# Patient Record
Sex: Female | Born: 1955 | Race: White | Hispanic: No | Marital: Single | Smoking: Former smoker
Health system: Southern US, Community
[De-identification: ages and names within clinical notes are randomized; demographics above are authoritative.]

## PROBLEM LIST (undated history)

## (undated) DIAGNOSIS — C801 Malignant (primary) neoplasm, unspecified: Secondary | ICD-10-CM

## (undated) DIAGNOSIS — Z860100 Personal history of colon polyps, unspecified: Secondary | ICD-10-CM

## (undated) DIAGNOSIS — F32A Depression, unspecified: Secondary | ICD-10-CM

## (undated) DIAGNOSIS — M722 Plantar fascial fibromatosis: Secondary | ICD-10-CM

## (undated) HISTORY — PX: WISDOM TOOTH EXTRACTION: SHX21

## (undated) HISTORY — DX: Plantar fascial fibromatosis: M72.2

## (undated) HISTORY — PX: UPPER GASTROINTESTINAL ENDOSCOPY: SHX188

## (undated) HISTORY — DX: Personal history of colonic polyps: Z86.010

## (undated) HISTORY — DX: Personal history of colon polyps, unspecified: Z86.0100

## (undated) HISTORY — DX: Malignant (primary) neoplasm, unspecified: C80.1

## (undated) HISTORY — DX: Depression, unspecified: F32.A

## (undated) HISTORY — PX: COLONOSCOPY: SHX174

## (undated) HISTORY — PX: SKIN CANCER EXCISION: SHX779

## (undated) HISTORY — PX: OTHER SURGICAL HISTORY: SHX169

---

## 2000-11-17 ENCOUNTER — Other Ambulatory Visit: Admission: RE | Admit: 2000-11-17 | Discharge: 2000-11-17 | Payer: Self-pay | Admitting: Internal Medicine

## 2012-05-12 ENCOUNTER — Encounter: Payer: Self-pay | Admitting: Internal Medicine

## 2012-06-30 ENCOUNTER — Ambulatory Visit (AMBULATORY_SURGERY_CENTER): Payer: 59 | Admitting: *Deleted

## 2012-06-30 VITALS — Ht 64.0 in | Wt 120.2 lb

## 2012-06-30 DIAGNOSIS — Z1211 Encounter for screening for malignant neoplasm of colon: Secondary | ICD-10-CM

## 2012-06-30 MED ORDER — PEG-KCL-NACL-NASULF-NA ASC-C 100 G PO SOLR: ORAL | Status: DC

## 2012-07-03 ENCOUNTER — Encounter: Payer: Self-pay | Admitting: Internal Medicine

## 2012-07-07 ENCOUNTER — Encounter: Payer: Self-pay | Admitting: Internal Medicine

## 2012-07-14 ENCOUNTER — Encounter: Payer: Self-pay | Admitting: Internal Medicine

## 2012-07-14 ENCOUNTER — Ambulatory Visit (AMBULATORY_SURGERY_CENTER): Payer: 59 | Admitting: Internal Medicine

## 2012-07-14 VITALS — BP 119/72 | HR 62 | Temp 96.6°F | Resp 18 | Ht 64.0 in | Wt 120.0 lb

## 2012-07-14 DIAGNOSIS — Z1211 Encounter for screening for malignant neoplasm of colon: Secondary | ICD-10-CM

## 2012-07-14 DIAGNOSIS — D126 Benign neoplasm of colon, unspecified: Secondary | ICD-10-CM

## 2012-07-14 MED ORDER — SODIUM CHLORIDE 0.9 % IV SOLN: 500.0000 mL | INTRAVENOUS | Status: DC

## 2012-07-14 NOTE — Progress Notes (Signed)
Patient did not experience any of the following events: a burn prior to discharge; a fall within the facility; wrong site/side/patient/procedure/implant event; or a hospital transfer or hospital admission upon discharge from the facility. (G8907) Patient did not have preoperative order for IV antibiotic SSI prophylaxis. (G8918)  

## 2012-07-14 NOTE — Patient Instructions (Addendum)
YOU HAD AN ENDOSCOPIC PROCEDURE TODAY AT THE [facility]: Refer to the procedure report that was given to you for any specific questions about what was found during the examination.  If the procedure report does not answer your questions, please call your gastroenterologist to clarify.  If you requested that your care partner not be given the details of your procedure findings, then the procedure report has been included in a sealed envelope for you to review at your convenience later.  YOU SHOULD EXPECT: Some feelings of bloating in the abdomen. Passage of more gas than usual.  Walking can help get rid of the air that was put into your GI tract during the procedure and reduce the bloating. If you had a lower endoscopy (such as a colonoscopy or flexible sigmoidoscopy) you may notice spotting of blood in your stool or on the toilet paper. If you underwent a bowel prep for your procedure, then you may not have a normal bowel movement for a few days.  DIET: Your first meal following the procedure should be a light meal and then it is ok to progress to your normal diet.  A half-sandwich or bowl of soup is an example of a good first meal.  Heavy or fried foods are harder to digest and may make you feel nauseous or bloated.  Likewise meals heavy in dairy and vegetables can cause extra gas to form and this can also increase the bloating.  Drink plenty of fluids but you should avoid alcoholic beverages for 24 hours.  ACTIVITY: Your care partner should take you home directly after the procedure.  You should plan to take it easy, moving slowly for the rest of the day.  You can resume normal activity the day after the procedure however you should NOT DRIVE or use heavy machinery for 24 hours (because of the sedation medicines used during the test).    SYMPTOMS TO REPORT IMMEDIATELY: A gastroenterologist can be reached at any hour.  During normal business hours, 8:30 AM to 5:00 PM Monday through Friday,  call [phone].  After hours and on weekends, please call the GI answering service at [phone] who will take a message and have the physician on call contact you.   Following lower endoscopy (colonoscopy or flexible sigmoidoscopy):  Excessive amounts of blood in the stool  Significant tenderness or worsening of abdominal pains  Swelling of the abdomen that is new, acute  Fever of 100F or    FOLLOW UP: If any biopsies were taken you will be contacted by phone or by letter within the next 1-3 weeks.  Call your gastroenterologist if you have not heard about the biopsies in 3 weeks.  Our staff will call the home number listed on your records the next business day following your procedure to check on you and address any questions or concerns that you may have at that time regarding the information given to you following your procedure. This is a courtesy call and so if there is no answer at the home number and we have not heard from you through the emergency physician on call, we will assume that you have returned to your regular daily activities without incident.  SIGNATURES/CONFIDENTIALITY: You and/or your care partner have signed paperwork which will be entered into your electronic medical record.  These signatures attest to the fact that that the information above on your After Visit Summary has been reviewed and is understood.  Full responsibility of the confidentiality of  this discharge information lies with you and/or your care-partner.   Polyp, diverticulosis, and high fiber information given.  Repeat colonoscopy in 6 months.

## 2012-07-14 NOTE — Progress Notes (Signed)
Report to pacu rn, vss, bbs=clear 

## 2012-07-14 NOTE — Progress Notes (Signed)
Called to room to assist during endoscopic procedure.  Patient ID and intended procedure confirmed with present staff. Received instructions for my participation in the procedure from the performing physician.  

## 2012-07-14 NOTE — Op Note (Signed)
[facility] [address] [place], [address]   COLONOSCOPY PROCEDURE REPORT  PATIENT: Shaunette, Gassner  MR#: [id number] BIRTHDATE: 1955/07/17 , 56  yrs. old GENDER: Female ENDOSCOPIST: Roxy Cedar, MD REFERRED XB:[id number] Tisovec, M.D. PROCEDURE DATE:  07/14/2012 PROCEDURE:   Colonoscopy with snare polypectomy  x 1 ASA CLASS:   Class II INDICATIONS:elevated risk screening. MEDICATIONS: MAC sedation, administered by CRNA and propofol (Diprivan) 270mg  IV  DESCRIPTION OF PROCEDURE:   After the risks benefits and alternatives of the procedure were thoroughly explained, informed consent was obtained.  A digital rectal exam revealed no abnormalities of the rectum.   The LB [id number]  endoscope was introduced through the anus and advanced to the cecum, which was identified by both the appendix and ileocecal valve. No adverse events experienced.   The quality of the prep was excellent, using MoviPrep  The instrument was then slowly withdrawn as the colon was fully examined.      COLON FINDINGS: A semi-pedunculated polyp measuring 35 mm in size was found in the ectum 5cm from the anal verge.  A piecemeal polypectomy was performed using snare cautery.  The resection was complete and the polyp tissue was completely retrieved.   Severe diverticulosis was noted throughout the entire examined colon. Retroflexed views revealed no abnormalities. The time to cecum=5 minutes 23 seconds.  Withdrawal time=9 minutes 44 seconds.  The scope was withdrawn and the procedure completed. COMPLICATIONS: There were no complications.  ENDOSCOPIC IMPRESSION: 1.   Semi-pedunculated polyp measuring 35 mm in size was found; peicemeal polypectomy was performed using snare cautery 2.   Severe diverticulosis was noted throughout the entire examined colon 3. Otherwise normal exam  RECOMMENDATIONS: 1. Repeat Colonoscopy in 6 months in no invasive cancer in  resection specimen.   eSigned:  Roxy Cedar, MD 07/14/2012 9:56 AM  cc: Guerry Bruin, MD and The Patient   PATIENT NAME:  Sarah Chase, Sarah Chase MR#: [id number]

## 2012-07-17 ENCOUNTER — Telehealth: Payer: Self-pay | Admitting: *Deleted

## 2012-07-17 NOTE — Telephone Encounter (Signed)
  Follow up Call-  Call back number 07/14/2012  Post procedure Call Back phone  # [phone]  Permission to leave phone message Yes     Patient questions:  Do you have a fever, pain , or abdominal swelling? no Pain Score  0 *  Have you tolerated food without any problems? yes  Have you been able to return to your normal activities? yes  Do you have any questions about your discharge instructions: Diet   no Medications  no Follow up visit  no  Do you have questions or concerns about your Care? no  Actions: * If pain score is 4 or above: No action needed, pain <4.

## 2012-07-21 ENCOUNTER — Encounter: Payer: Self-pay | Admitting: Internal Medicine

## 2012-11-18 ENCOUNTER — Encounter: Payer: Self-pay | Admitting: Internal Medicine

## 2012-11-28 ENCOUNTER — Encounter: Payer: Self-pay | Admitting: Internal Medicine

## 2013-01-16 ENCOUNTER — Encounter: Payer: Self-pay | Admitting: Internal Medicine

## 2013-01-24 ENCOUNTER — Ambulatory Visit: Payer: 59 | Admitting: Internal Medicine

## 2013-03-13 ENCOUNTER — Ambulatory Visit (AMBULATORY_SURGERY_CENTER): Payer: Self-pay

## 2013-03-13 VITALS — Ht 64.0 in | Wt 121.0 lb

## 2013-03-13 DIAGNOSIS — Z800 Family history of malignant neoplasm of digestive organs: Secondary | ICD-10-CM

## 2013-03-13 DIAGNOSIS — Z86010 Personal history of colonic polyps: Secondary | ICD-10-CM

## 2013-03-13 MED ORDER — MOVIPREP 100 G PO SOLR: ORAL | Status: DC

## 2013-03-16 ENCOUNTER — Encounter: Payer: Self-pay | Admitting: Internal Medicine

## 2013-03-27 ENCOUNTER — Ambulatory Visit (AMBULATORY_SURGERY_CENTER): Payer: 59 | Admitting: Internal Medicine

## 2013-03-27 ENCOUNTER — Encounter: Payer: Self-pay | Admitting: Internal Medicine

## 2013-03-27 VITALS — BP 106/73 | HR 64 | Temp 97.8°F | Resp 17 | Ht 64.0 in | Wt 121.0 lb

## 2013-03-27 DIAGNOSIS — D126 Benign neoplasm of colon, unspecified: Secondary | ICD-10-CM

## 2013-03-27 DIAGNOSIS — Z86010 Personal history of colonic polyps: Secondary | ICD-10-CM

## 2013-03-27 MED ORDER — SODIUM CHLORIDE 0.9 % IV SOLN: 500.0000 mL | INTRAVENOUS | Status: DC

## 2013-03-27 NOTE — Progress Notes (Signed)
Called to room to assist during endoscopic procedure.  Patient ID and intended procedure confirmed with present staff. Received instructions for my participation in the procedure from the performing physician.  

## 2013-03-27 NOTE — Op Note (Signed)
[facility] [address] [place], [address]   COLONOSCOPY PROCEDURE REPORT  PATIENT: Sarah, Chase  MR#: [id number] BIRTHDATE: 08-13-1955 , 57  yrs. old GENDER: Female ENDOSCOPIST: Roxy Cedar, MD REFERRED XB:[id number] Program Recall PROCEDURE DATE:  03/27/2013 PROCEDURE:   Colonoscopy with snare polypectomy x 2 First Screening Colonoscopy - Avg.  risk and is 50 yrs.  old or older - No.  Prior Negative Screening - Now for repeat screening. N/A  History of Adenoma - Now for follow-up colonoscopy & has been > or = to 3 yrs.  No.  It has been less than 3 yrs since last colonoscopy.  Medical reason.  Polyps Removed Today? Yes. ASA CLASS:   Class II INDICATIONS:Patient's personal history of adenomatous colon polyps. large rectal polyp (adenomatous) removed piecemeal 07-2012 MEDICATIONS: MAC sedation, administered by CRNA and propofol (Diprivan) 300mg  IV DESCRIPTION OF PROCEDURE:   After the risks benefits and alternatives of the procedure were thoroughly explained, informed consent was obtained.  A digital rectal exam revealed no abnormalities of the rectum.   The LB [id number]  endoscope was introduced through the anus and advanced to the cecum, which was identified by both the appendix and ileocecal valve. No adverse events experienced.   The quality of the prep was excellent, using MoviPrep  The instrument was then slowly withdrawn as the colon was fully examined.  COLON FINDINGS: A diminutive polyp was found at the cecum and removed with a cold snare.   A sessile polyp measuring 10 mm in size was found in the rectum at the prior polypectomy site.  A polypectomy was performed using snare cautery.  The piecemeal resection was complete. Tissue was completely retrieved from each. Moderate diverticulosis was noted in the right and left colon. The colon mucosa was otherwise normal.  Retroflexed views revealed internal hemorrhoids. The time to cecum=3  minutes 0 seconds. Withdrawal time=8 minutes 15 seconds.  The scope was withdrawn and the procedure completed. COMPLICATIONS: There were no complications.  ENDOSCOPIC IMPRESSION: 1.   Diminutive polyp was found at the cecum; polypectomy was performed with a cold snare 2.   Sessile polyp measuring 10 mm in size was found in the rectum; polypectomy was performed using snare cautery 3.   Moderate diverticulosis was noted in the right colon and left colon 4.   The colon mucosa was otherwise normal  RECOMMENDATIONS: 1. Repeat Colonoscopy in 1 year.   eSigned:  Roxy Cedar, MD 03/27/2013 9:55 AM   cc: Guerry Bruin, MD and The Patient   PATIENT NAME:  Sarah, Chase MR#: [id number]

## 2013-03-27 NOTE — Progress Notes (Signed)
Report to pacu rn, vss, bbs=clear 

## 2013-03-27 NOTE — Progress Notes (Signed)
Patient did not experience any of the following events: a burn prior to discharge; a fall within the facility; wrong site/side/patient/procedure/implant event; or a hospital transfer or hospital admission upon discharge from the facility. (G8907) Patient did not have preoperative order for IV antibiotic SSI prophylaxis. (G8918)  

## 2013-03-27 NOTE — Patient Instructions (Signed)

## 2013-03-28 ENCOUNTER — Telehealth: Payer: Self-pay

## 2013-03-28 NOTE — Telephone Encounter (Signed)
  Follow up Call-  Call back number 03/27/2013 07/14/2012  Post procedure Call Back phone  # [phone] cell [phone]  Permission to leave phone message Yes Yes     Patient questions:  Do you have a fever, pain , or abdominal swelling? no Pain Score  0 *  Have you tolerated food without any problems? yes  Have you been able to return to your normal activities? yes  Do you have any questions about your discharge instructions: Diet   no Medications  no Follow up visit  no  Do you have questions or concerns about your Care? no  Actions: * If pain score is 4 or above: No action needed, pain <4.

## 2013-04-04 ENCOUNTER — Encounter: Payer: Self-pay | Admitting: Internal Medicine

## 2014-01-11 ENCOUNTER — Encounter: Payer: Self-pay | Admitting: Internal Medicine

## 2014-01-21 ENCOUNTER — Encounter: Payer: Self-pay | Admitting: Internal Medicine

## 2014-03-14 ENCOUNTER — Ambulatory Visit (AMBULATORY_SURGERY_CENTER): Payer: Self-pay | Admitting: *Deleted

## 2014-03-14 VITALS — Ht 64.0 in | Wt 115.0 lb

## 2014-03-14 DIAGNOSIS — Z86010 Personal history of colonic polyps: Secondary | ICD-10-CM

## 2014-03-14 MED ORDER — MOVIPREP 100 G PO SOLR: ORAL | Status: DC

## 2014-03-14 NOTE — Progress Notes (Signed)
EGGS IN LARGE AMOUNTS CALL STOMACH SPASMS.  HAD PROPOFOL WITH LAST SEVERAL COLONOSCOPIES WITH NO PROBLEM.   No problems with anesthesia.  Pt not given Emmi instructions for colonoscopy; has already see video  No oxygen use  No diet drug use

## 2014-03-28 ENCOUNTER — Ambulatory Visit (AMBULATORY_SURGERY_CENTER): Payer: 59 | Admitting: Internal Medicine

## 2014-03-28 ENCOUNTER — Encounter: Payer: Self-pay | Admitting: Internal Medicine

## 2014-03-28 VITALS — BP 115/74 | HR 59 | Temp 96.7°F | Resp 26 | Ht 64.0 in | Wt 115.0 lb

## 2014-03-28 DIAGNOSIS — Z86010 Personal history of colonic polyps: Secondary | ICD-10-CM

## 2014-03-28 MED ORDER — SODIUM CHLORIDE 0.9 % IV SOLN: 500.0000 mL | INTRAVENOUS | Status: DC

## 2014-03-28 NOTE — Op Note (Signed)
[place]  [address]. [place], [id number]   COLONOSCOPY PROCEDURE REPORT  PATIENT: Sarah, Chase  MR#: [id number] BIRTHDATE: [birth year] , 76  yrs. old GENDER: female ENDOSCOPIST: Eustace Quail, MD REFERRED QP:[id number] Program Recall PROCEDURE DATE:  03/28/2014 PROCEDURE:   Colonoscopy, surveillance First Screening Colonoscopy - Avg.  risk and is 50 yrs.  old or older - No.  Prior Negative Screening - Now for repeat screening. N/A  History of Adenoma - Now for follow-up colonoscopy & has been > or = to 3 yrs.  No.  It has been less than 3 yrs since last colonoscopy.  Medical reason.  Polyps Removed Today? No.  Polyps Removed Today? No.  Recommend repeat exam, <10 yrs? Yes.  High risk (family or personal hx). ASA CLASS:   Class II INDICATIONS:surveillance colonoscopy based on a history of adenomatous colonic polyp(s).   Index 07-2012 (LARGE RECTAL PIECEMEAL); follow-up 03-2013 ((RESIDUAL 1CM RECATAL PIECEMEAL) MEDICATIONS: Monitored anesthesia care and Propofol 200 mg IV  DESCRIPTION OF PROCEDURE:   After the risks benefits and alternatives of the procedure were thoroughly explained, informed consent was obtained.  The digital rectal exam revealed no abnormalities of the rectum.   The LB [id number]  endoscope was introduced through the anus and advanced to the cecum, which was identified by both the appendix and ileocecal valve. No adverse events experienced.   The quality of the prep was excellent, using MoviPrep  The instrument was then slowly withdrawn as the colon was fully examined.   COLON FINDINGS: There was moderate diverticulosis noted throughout the entire examined colon.   The examination was otherwise normal. Retroflexed views revealed internal hemorrhoids. The time to cecum=2 minutes 34 seconds.  Withdrawal time=5 minutes 0 seconds. The scope was withdrawn and the procedure completed. COMPLICATIONS: There were no immediate  complications.  ENDOSCOPIC IMPRESSION: 1.   Moderate diverticulosis was noted throughout the entire examined colon 2.   The examination was otherwise normal  RECOMMENDATIONS: 1. Repeat Colonoscopy in 3 years.  eSigned:  Eustace Quail, MD 03/28/2014 8:32 AM   cc: The Patient and Domenick Gong, MD

## 2014-03-28 NOTE — Patient Instructions (Signed)
YOU HAD AN ENDOSCOPIC PROCEDURE TODAY AT THE [facility]: Refer to the procedure report that was given to you for any specific questions about what was found during the examination.  If the procedure report does not answer your questions, please call your gastroenterologist to clarify.  If you requested that your care partner not be given the details of your procedure findings, then the procedure report has been included in a sealed envelope for you to review at your convenience later.  YOU SHOULD EXPECT: Some feelings of bloating in the abdomen. Passage of more gas than usual.  Walking can help get rid of the air that was put into your GI tract during the procedure and reduce the bloating. If you had a lower endoscopy (such as a colonoscopy or flexible sigmoidoscopy) you may notice spotting of blood in your stool or on the toilet paper. If you underwent a bowel prep for your procedure, then you may not have a normal bowel movement for a few days.  DIET: Your first meal following the procedure should be a light meal and then it is ok to progress to your normal diet.  A half-sandwich or bowl of soup is an example of a good first meal.  Heavy or fried foods are harder to digest and may make you feel nauseous or bloated.  Likewise meals heavy in dairy and vegetables can cause extra gas to form and this can also increase the bloating.  Drink plenty of fluids but you should avoid alcoholic beverages for 24 hours.  ACTIVITY: Your care partner should take you home directly after the procedure.  You should plan to take it easy, moving slowly for the rest of the day.  You can resume normal activity the day after the procedure however you should NOT DRIVE or use heavy machinery for 24 hours (because of the sedation medicines used during the test).    SYMPTOMS TO REPORT IMMEDIATELY: A gastroenterologist can be reached at any hour.  During normal business hours, 8:30 AM to 5:00 PM Monday through Friday,  call [phone].  After hours and on weekends, please call the GI answering service at [phone] who will take a message and have the physician on call contact you.   Following lower endoscopy (colonoscopy or flexible sigmoidoscopy):  Excessive amounts of blood in the stool  Significant tenderness or worsening of abdominal pains  Swelling of the abdomen that is new, acute  Fever of 100F or higher  Following upper endoscopy (EGD)  Vomiting of blood or coffee ground material  New chest pain or pain under the shoulder blades  Painful or persistently difficult swallowing  New shortness of breath  Fever of 100F or higher  Black, tarry-looking stools  FOLLOW UP: If any biopsies were taken you will be contacted by phone or by letter within the next 1-3 weeks.  Call your gastroenterologist if you have not heard about the biopsies in 3 weeks.  Our staff will call the home number listed on your records the next business day following your procedure to check on you and address any questions or concerns that you may have at that time regarding the information given to you following your procedure. This is a courtesy call and so if there is no answer at the home number and we have not heard from you through the emergency physician on call, we will assume that you have returned to your regular daily activities without incident.  SIGNATURES/CONFIDENTIALITY: You and/or your care   partner have signed paperwork which will be entered into your electronic medical record.  These signatures attest to the fact that that the information above on your After Visit Summary has been reviewed and is understood.  Full responsibility of the confidentiality of this discharge information lies with you and/or your care-partner.    INFORMATION ON DIVERTICULOSIS GIVEN TO YOU TODAY

## 2014-03-28 NOTE — Progress Notes (Signed)
  [facility] Post-op Note  Patient: Sarah Chase  Procedure(s) Performed: colonoscopy  Patient Location: [facility] - Recovery Area  Anesthesia Type: Deep Sedation/Propofol  Level of Consciousness: awake, oriented and patient cooperative  Airway and Oxygen Therapy: Patient Spontanous Breathing  Post-op Pain: none  Post-op Assessment:  Post-op Vital signs reviewed, Patient's Cardiovascular Status Stable, Respiratory Function Stable, Patent Airway, No signs of Nausea or vomiting and Pain level controlled  Post-op Vital Signs: Reviewed and stable  Complications: No apparent anesthesia complications  Meade Hogeland E 8:32 AM

## 2014-03-29 ENCOUNTER — Telehealth: Payer: Self-pay | Admitting: *Deleted

## 2014-03-29 NOTE — Telephone Encounter (Signed)
  Follow up Call-  Call back number 03/28/2014 03/27/2013 07/14/2012  Post procedure Call Back phone  # [phone] cell [phone]  Permission to leave phone message Yes Yes Yes     Patient questions:  Do you have a fever, pain , or abdominal swelling? No. Pain Score  0 *  Have you tolerated food without any problems? Yes.    Have you been able to return to your normal activities? Yes.    Do you have any questions about your discharge instructions: Diet   No. Medications  No. Follow up visit  No.  Do you have questions or concerns about your Care? No.  Actions: * If pain score is 4 or above: No action needed, pain <4.

## 2016-05-28 DIAGNOSIS — Z0000 Encounter for general adult medical examination without abnormal findings: Secondary | ICD-10-CM | POA: Diagnosis not present

## 2016-06-04 DIAGNOSIS — Z1389 Encounter for screening for other disorder: Secondary | ICD-10-CM | POA: Diagnosis not present

## 2016-06-04 DIAGNOSIS — Z0000 Encounter for general adult medical examination without abnormal findings: Secondary | ICD-10-CM | POA: Diagnosis not present

## 2016-06-04 DIAGNOSIS — D126 Benign neoplasm of colon, unspecified: Secondary | ICD-10-CM | POA: Diagnosis not present

## 2016-06-04 DIAGNOSIS — M65819 Other synovitis and tenosynovitis, unspecified shoulder: Secondary | ICD-10-CM | POA: Diagnosis not present

## 2017-02-15 DIAGNOSIS — C44319 Basal cell carcinoma of skin of other parts of face: Secondary | ICD-10-CM | POA: Diagnosis not present

## 2017-02-15 DIAGNOSIS — L570 Actinic keratosis: Secondary | ICD-10-CM | POA: Diagnosis not present

## 2017-02-15 DIAGNOSIS — L723 Sebaceous cyst: Secondary | ICD-10-CM | POA: Diagnosis not present

## 2017-02-15 DIAGNOSIS — D485 Neoplasm of uncertain behavior of skin: Secondary | ICD-10-CM | POA: Diagnosis not present

## 2017-04-13 ENCOUNTER — Encounter: Payer: Self-pay | Admitting: Internal Medicine

## 2017-05-17 DIAGNOSIS — C44311 Basal cell carcinoma of skin of nose: Secondary | ICD-10-CM | POA: Diagnosis not present

## 2017-06-03 DIAGNOSIS — R82998 Other abnormal findings in urine: Secondary | ICD-10-CM | POA: Diagnosis not present

## 2017-06-03 DIAGNOSIS — Z0000 Encounter for general adult medical examination without abnormal findings: Secondary | ICD-10-CM | POA: Diagnosis not present

## 2017-06-10 DIAGNOSIS — R808 Other proteinuria: Secondary | ICD-10-CM | POA: Diagnosis not present

## 2017-06-10 DIAGNOSIS — D126 Benign neoplasm of colon, unspecified: Secondary | ICD-10-CM | POA: Diagnosis not present

## 2017-06-10 DIAGNOSIS — Z1389 Encounter for screening for other disorder: Secondary | ICD-10-CM | POA: Diagnosis not present

## 2017-06-10 DIAGNOSIS — N6019 Diffuse cystic mastopathy of unspecified breast: Secondary | ICD-10-CM | POA: Diagnosis not present

## 2017-06-10 DIAGNOSIS — Z0000 Encounter for general adult medical examination without abnormal findings: Secondary | ICD-10-CM | POA: Diagnosis not present

## 2017-09-15 DIAGNOSIS — L814 Other melanin hyperpigmentation: Secondary | ICD-10-CM | POA: Diagnosis not present

## 2017-09-15 DIAGNOSIS — D1801 Hemangioma of skin and subcutaneous tissue: Secondary | ICD-10-CM | POA: Diagnosis not present

## 2017-09-15 DIAGNOSIS — L570 Actinic keratosis: Secondary | ICD-10-CM | POA: Diagnosis not present

## 2017-09-15 DIAGNOSIS — L821 Other seborrheic keratosis: Secondary | ICD-10-CM | POA: Diagnosis not present

## 2017-10-19 DIAGNOSIS — L720 Epidermal cyst: Secondary | ICD-10-CM | POA: Diagnosis not present

## 2017-10-28 DIAGNOSIS — L720 Epidermal cyst: Secondary | ICD-10-CM | POA: Diagnosis not present

## 2018-06-16 DIAGNOSIS — R82998 Other abnormal findings in urine: Secondary | ICD-10-CM | POA: Diagnosis not present

## 2018-06-16 DIAGNOSIS — Z0000 Encounter for general adult medical examination without abnormal findings: Secondary | ICD-10-CM | POA: Diagnosis not present

## 2018-06-16 DIAGNOSIS — E7800 Pure hypercholesterolemia, unspecified: Secondary | ICD-10-CM | POA: Diagnosis not present

## 2018-06-23 DIAGNOSIS — Z800 Family history of malignant neoplasm of digestive organs: Secondary | ICD-10-CM | POA: Diagnosis not present

## 2018-06-23 DIAGNOSIS — Z87891 Personal history of nicotine dependence: Secondary | ICD-10-CM | POA: Diagnosis not present

## 2018-06-23 DIAGNOSIS — Z0000 Encounter for general adult medical examination without abnormal findings: Secondary | ICD-10-CM | POA: Diagnosis not present

## 2018-06-23 DIAGNOSIS — E7800 Pure hypercholesterolemia, unspecified: Secondary | ICD-10-CM | POA: Diagnosis not present

## 2018-11-13 ENCOUNTER — Encounter: Payer: Self-pay | Admitting: Internal Medicine

## 2018-12-07 ENCOUNTER — Ambulatory Visit (AMBULATORY_SURGERY_CENTER): Payer: Self-pay | Admitting: *Deleted

## 2018-12-07 ENCOUNTER — Other Ambulatory Visit: Payer: Self-pay

## 2018-12-07 VITALS — Temp 96.9°F | Ht 64.0 in | Wt 127.0 lb

## 2018-12-07 DIAGNOSIS — Z86010 Personal history of colonic polyps: Secondary | ICD-10-CM

## 2018-12-07 MED ORDER — SUPREP BOWEL PREP KIT 17.5-3.13-1.6 GM/177ML PO SOLN: 1.0000 | Freq: Once | ORAL | 0 refills | Status: AC

## 2018-12-07 NOTE — Progress Notes (Signed)
Eggs in large amts cause GI upset only- no issues with MAC 2017 colon   No soy allergy known to patient  No issues with past sedation with any surgeries  or procedures, no intubation problems  No diet pills per patient No home 02 use per patient  No blood thinners per patient  Pt denies issues with constipation  No A fib or A flutter  EMMI video sent to pt's e mail   Suprep $15 coupon to pt

## 2018-12-11 ENCOUNTER — Encounter: Payer: Self-pay | Admitting: Internal Medicine

## 2018-12-20 ENCOUNTER — Telehealth: Payer: Self-pay

## 2018-12-20 NOTE — Telephone Encounter (Signed)
Pt answered  "NO" to all covid questions. °

## 2018-12-20 NOTE — Telephone Encounter (Signed)
Covid-19 screening questions   Do you now or have you had a fever in the last 14 days?  Do you have any respiratory symptoms of shortness of breath or cough now or in the last 14 days?  Do you have any family members or close contacts with diagnosed or suspected Covid-19 in the past 14 days?  Have you been tested for Covid-19 and found to be positive?       

## 2018-12-21 ENCOUNTER — Ambulatory Visit (AMBULATORY_SURGERY_CENTER): Payer: 59 | Admitting: Internal Medicine

## 2018-12-21 ENCOUNTER — Encounter: Payer: Self-pay | Admitting: Internal Medicine

## 2018-12-21 ENCOUNTER — Other Ambulatory Visit: Payer: Self-pay

## 2018-12-21 VITALS — BP 113/71 | HR 60 | Temp 97.6°F | Resp 10 | Ht 64.0 in | Wt 127.0 lb

## 2018-12-21 DIAGNOSIS — D123 Benign neoplasm of transverse colon: Secondary | ICD-10-CM

## 2018-12-21 DIAGNOSIS — D120 Benign neoplasm of cecum: Secondary | ICD-10-CM

## 2018-12-21 DIAGNOSIS — Z86010 Personal history of colonic polyps: Secondary | ICD-10-CM

## 2018-12-21 DIAGNOSIS — D122 Benign neoplasm of ascending colon: Secondary | ICD-10-CM | POA: Diagnosis not present

## 2018-12-21 MED ORDER — SODIUM CHLORIDE 0.9 % IV SOLN: 500.0000 mL | Freq: Once | INTRAVENOUS | Status: DC

## 2018-12-21 NOTE — Progress Notes (Signed)
Called to room to assist during endoscopic procedure.  Patient ID and intended procedure confirmed with present staff. Received instructions for my participation in the procedure from the performing physician.  

## 2018-12-21 NOTE — Patient Instructions (Signed)
Information on polyps given to you today.  Await pathology results.   YOU HAD AN ENDOSCOPIC PROCEDURE TODAY AT THE [facility]:   Refer to the procedure report that was given to you for any specific questions about what was found during the examination.  If the procedure report does not answer your questions, please call your gastroenterologist to clarify.  If you requested that your care partner not be given the details of your procedure findings, then the procedure report has been included in a sealed envelope for you to review at your convenience later.  YOU SHOULD EXPECT: Some feelings of bloating in the abdomen. Passage of more gas than usual.  Walking can help get rid of the air that was put into your GI tract during the procedure and reduce the bloating. If you had a lower endoscopy (such as a colonoscopy or flexible sigmoidoscopy) you may notice spotting of blood in your stool or on the toilet paper. If you underwent a bowel prep for your procedure, you may not have a normal bowel movement for a few days.  Please Note:  You might notice some irritation and congestion in your nose or some drainage.  This is from the oxygen used during your procedure.  There is no need for concern and it should clear up in a day or so.  SYMPTOMS TO REPORT IMMEDIATELY:   Following lower endoscopy (colonoscopy or flexible sigmoidoscopy):  Excessive amounts of blood in the stool  Significant tenderness or worsening of abdominal pains  Swelling of the abdomen that is new, acute  Fever of 100F or higher   For urgent or emergent issues, a gastroenterologist can be reached at any hour by calling [phone].   DIET:  We do recommend a small meal at first, but then you may proceed to your regular diet.  Drink plenty of fluids but you should avoid alcoholic beverages for 24 hours.  ACTIVITY:  You should plan to take it easy for the rest of today and you should NOT DRIVE or use heavy machinery  until tomorrow (because of the sedation medicines used during the test).    FOLLOW UP: Our staff will call the number listed on your records 48-72 hours following your procedure to check on you and address any questions or concerns that you may have regarding the information given to you following your procedure. If we do not reach you, we will leave a message.  We will attempt to reach you two times.  During this call, we will ask if you have developed any symptoms of COVID 19. If you develop any symptoms (ie: fever, flu-like symptoms, shortness of breath, cough etc.) before then, please call [phone].  If you test positive for Covid 19 in the 2 weeks post procedure, please call and report this information to us.    If any biopsies were taken you will be contacted by phone or by letter within the next 1-3 weeks.  Please call us at [phone] if you have not heard about the biopsies in 3 weeks.    SIGNATURES/CONFIDENTIALITY: You and/or your care partner have signed paperwork which will be entered into your electronic medical record.  These signatures attest to the fact that that the information above on your After Visit Summary has been reviewed and is understood.  Full responsibility of the confidentiality of this discharge information lies with you and/or your care-partner. 

## 2018-12-21 NOTE — Progress Notes (Signed)
PT taken to PACU. Monitors in place. VSS. Report given to RN. 

## 2018-12-21 NOTE — Op Note (Signed)
[facility] Patient Name: Sarah Chase Procedure Date: 12/21/2018 8:01 AM MRN: [id number] Endoscopist: Docia Chuck. Henrene Pastor , MD Age: 63 Referring MD:  Date of Birth: 1956/02/08 Gender: Female Account #: [id number] Procedure:                Colonoscopy with cold snare polypectomy x 5; with                            submucosal injection (tattoo) Indications:              High risk colon cancer surveillance: Personal                            history of adenoma (10 mm or greater in size).                            Index exam April 2014 with large rectal adenoma                            removed piecemeal. Follow-up examination December                            2014 with 1 cm residual adenoma removed. Most                            recent examination December 2015 was negative for                            neoplasia. The patient was to follow-up in 3 years,                            but follows up now. Medicines:                Monitored Anesthesia Care Procedure:                Pre-Anesthesia Assessment:                           - Prior to the procedure, a History and Physical                            was performed, and patient medications and                            allergies were reviewed. The patient's tolerance of                            previous anesthesia was also reviewed. The risks                            and benefits of the procedure and the sedation                            options and risks were discussed with the patient.  All questions were answered, and informed consent                            was obtained. Prior Anticoagulants: The patient has                            taken no previous anticoagulant or antiplatelet                            agents. ASA Grade Assessment: II - A patient with                            mild systemic disease. After reviewing the risks                            and benefits, the  patient was deemed in                            satisfactory condition to undergo the procedure.                           After obtaining informed consent, the colonoscope                            was passed under direct vision. Throughout the                            procedure, the patient's blood pressure, pulse, and                            oxygen saturations were monitored continuously. The                            Colonoscope was introduced through the anus and                            advanced to the the cecum, identified by                            appendiceal orifice and ileocecal valve. The                            ileocecal valve, appendiceal orifice, and rectum                            were photographed. The quality of the bowel                            preparation was excellent. The colonoscopy was                            performed without difficulty. The patient tolerated  the procedure well. The bowel preparation used was                            SUPREP via split dose instruction. Scope In: 8:30:10 AM Scope Out: 8:56:24 AM Scope Withdrawal Time: 0 hours 21 minutes 29 seconds  Total Procedure Duration: 0 hours 26 minutes 14 seconds  Findings:                 A 8 mm polyp was found in the transverse colon. The                            polyp was sessile with a central depression. See                            photograph. The polyp was removed with a cold                            snare. Resection and retrieval were complete. Area                            was tattooed with an injection of Spot (carbon                            black) just distal (downstream) to the lesion. See                            photograph.                           Four polyps were found in the ascending colon and                            cecum. The polyps were 1 to 4 mm in size. These                            polyps were removed with a cold  snare. Resection                            and retrieval were complete.                           Multiple diverticula were found in the entire colon.                           The rectum revealed scar only from prior                            polypectomy site. The exam was otherwise without                            abnormality on direct and retroflexion views. Complications:            No immediate complications. Estimated blood loss:  None. Estimated Blood Loss:     Estimated blood loss: none. Impression:               - One 8 mm polyp in the transverse colon, removed                            with a cold snare. Resected and retrieved. Tattooed.                           - Four 1 to 4 mm polyps in the ascending colon and                            in the cecum, removed with a cold snare. Resected                            and retrieved.                           - Diverticulosis in the entire examined colon. No                            residual rectal polyp.                           - The examination was otherwise normal on direct                            and retroflexion views. Recommendation:           - Repeat colonoscopy in 3 years for surveillance.                           - Patient has a contact number available for                            emergencies. The signs and symptoms of potential                            delayed complications were discussed with the                            patient. Return to normal activities tomorrow.                            Written discharge instructions were provided to the                            patient.                           - Resume previous diet.                           - Continue present medications.                           -  Await pathology results. Docia Chuck. Henrene Pastor, MD 12/21/2018 9:17:18 AM This report has been signed electronically.

## 2018-12-21 NOTE — Progress Notes (Signed)
Temperature- June Bullock VS- Courtney Washington  Pt's states no medical or surgical changes since previsit or office visit.  

## 2018-12-25 ENCOUNTER — Telehealth: Payer: Self-pay

## 2018-12-25 NOTE — Telephone Encounter (Signed)
  Follow up Call-  Call back number 12/21/2018  Post procedure Call Back phone  # [phone]  Permission to leave phone message Yes  Some recent data might be hidden     Patient questions:  Do you have a fever, pain , or abdominal swelling? No. Pain Score  0 *  Have you tolerated food without any problems? Yes.    Have you been able to return to your normal activities? Yes.    Do you have any questions about your discharge instructions: Diet   No. Medications  No. Follow up visit  No.  Do you have questions or concerns about your Care? No.  Actions: * If pain score is 4 or above: No action needed, pain <4.   1. Have you developed a fever since your procedure? no  2.   Have you had an respiratory symptoms (SOB or cough) since your procedure? no  3.   Have you tested positive for COVID 19 since your procedure no  4.   Have you had any family members/close contacts diagnosed with the COVID 19 since your procedure?  no   If yes to any of these questions please route to Joylene John, RN and Alphonsa Gin, Therapist, sports.

## 2018-12-26 ENCOUNTER — Encounter: Payer: Self-pay | Admitting: Internal Medicine

## 2019-04-19 ENCOUNTER — Ambulatory Visit: Payer: 59 | Attending: Internal Medicine

## 2019-04-19 DIAGNOSIS — Z20822 Contact with and (suspected) exposure to covid-19: Secondary | ICD-10-CM

## 2019-04-21 LAB — NOVEL CORONAVIRUS, NAA: SARS-CoV-2, NAA: NOT DETECTED

## 2019-07-11 ENCOUNTER — Other Ambulatory Visit: Payer: Self-pay | Admitting: Internal Medicine

## 2019-11-15 ENCOUNTER — Other Ambulatory Visit: Payer: Self-pay

## 2019-11-15 ENCOUNTER — Ambulatory Visit (INDEPENDENT_AMBULATORY_CARE_PROVIDER_SITE_OTHER): Payer: 59

## 2019-11-15 ENCOUNTER — Ambulatory Visit: Payer: 59 | Admitting: Podiatry

## 2019-11-15 ENCOUNTER — Encounter: Payer: Self-pay | Admitting: Podiatry

## 2019-11-15 DIAGNOSIS — M722 Plantar fascial fibromatosis: Secondary | ICD-10-CM

## 2019-11-15 MED ORDER — MELOXICAM 15 MG PO TABS: 15.0000 mg | ORAL_TABLET | Freq: Every day | ORAL | 3 refills | Status: DC

## 2019-11-15 MED ORDER — METHYLPREDNISOLONE 4 MG PO TBPK: ORAL_TABLET | ORAL | 0 refills | Status: DC

## 2019-11-16 NOTE — Progress Notes (Signed)
  Subjective:  Patient ID: Sarah Chase, female    DOB: [birth year],  MRN: [id number] HPI Chief Complaint  Patient presents with  . Foot Pain    Plantar foot "all throughout" bilateral - works 12 hour shifts, tenderness by end of day, ongoing x several years, managed with insoles, different shoes (wears steel toes), knees and back hurting now too, interested in orthotics   . New Patient (Initial Visit)    64 y.o. female presents with the above complaint.   ROS: Denies fever chills nausea vomiting muscle aches pains calf pain back pain chest pain shortness of breath.  Past Medical History:  Diagnosis Date  . Cancer ([place])    basal cell x3 / on nose  . Hx of colonic polyps    Past Surgical History:  Procedure Laterality Date  . biopsy of cyst in bilateral breasts    . COLONOSCOPY    . SKIN CANCER EXCISION     x 3 nose   . UPPER GASTROINTESTINAL ENDOSCOPY    . WISDOM TOOTH EXTRACTION      Current Outpatient Medications:  .  buPROPion (WELLBUTRIN XL) 150 MG 24 hr tablet, Take 150 mg by mouth daily., Disp: , Rfl:  .  ibuprofen (ADVIL,MOTRIN) 200 MG tablet, Take 200 mg by mouth every 6 (six) hours as needed., Disp: , Rfl:  .  meloxicam (MOBIC) 15 MG tablet, Take 1 tablet (15 mg total) by mouth daily., Disp: 30 tablet, Rfl: 3 .  methylPREDNISolone (MEDROL DOSEPAK) 4 MG TBPK tablet, 6 day dose pack - take as directed, Disp: 21 tablet, Rfl: 0  Allergies  Allergen Reactions  . Codeine     Stomach and colon spasms  . Eggs Or Egg-Derived Products     Large amt eggs cause stomach spasms.   Review of Systems Objective:  There were no vitals filed for this visit.  General: Well developed, nourished, in no acute distress, alert and oriented x3   Dermatological: Skin is warm, dry and supple bilateral. Nails x 10 are well maintained; remaining integument appears unremarkable at this time. There are no open sores, no preulcerative lesions, no rash or signs of infection  present.  Vascular: Dorsalis Pedis artery and Posterior Tibial artery pedal pulses are 2/4 bilateral with immedate capillary fill time. Pedal hair growth present. No varicosities and no lower extremity edema present bilateral.   Neruologic: Grossly intact via light touch bilateral. Vibratory intact via tuning fork bilateral. Protective threshold with Semmes Wienstein monofilament intact to all pedal sites bilateral. Patellar and Achilles deep tendon reflexes 2+ bilateral. No Babinski or clonus noted bilateral.   Musculoskeletal: No gross boney pedal deformities bilateral. No pain, crepitus, or limitation noted with foot and ankle range of motion bilateral. Muscular strength 5/5 in all groups tested bilateral.  Pain on palpation medial longitudinal arch and at the insertion of the plantar fascial cannula surgical site.  Gait: Unassisted, Nonantalgic.    Radiographs:  Radiographs taken demonstrate soft tissue increase in density plantar fashion calcaneal insertion site.  Assessment & Plan:   Assessment: Plantar fasciitis bilaterally.  Plan: She will see Liliane Channel for orthotics.  I also injected her bilaterally 20 mg Kenalog 5 mg Marcaine point maximal tenderness.     Shantia Sanford T. [place], [place]

## 2019-12-06 ENCOUNTER — Ambulatory Visit: Payer: 59 | Admitting: Podiatry

## 2019-12-06 ENCOUNTER — Ambulatory Visit: Payer: 59 | Admitting: Orthotics

## 2019-12-06 ENCOUNTER — Encounter: Payer: Self-pay | Admitting: Podiatry

## 2019-12-06 ENCOUNTER — Other Ambulatory Visit: Payer: Self-pay

## 2019-12-06 DIAGNOSIS — M722 Plantar fascial fibromatosis: Secondary | ICD-10-CM

## 2019-12-06 NOTE — Progress Notes (Signed)
Sarah Chase presents today for follow-up of Sarah Chase bilateral plantar fasciitis states that she is approximately 75% improved and she is ready to pick up Sarah Chase orthotics today.  She is very happy with the outcome thus far.  Objective: Vital signs are stable alert oriented x3 she has no new pain on palpation of the medial calcaneal tubercles bilaterally.  There is no firmness there is no warmth on palpation.  Assessment: Most likely greater than 75% resolution of plantar fasciitis.  Plan: Dispensed orthotics today encouraged Sarah Chase to continue all conservative therapies follow-up with me in 1 month.

## 2019-12-06 NOTE — Progress Notes (Signed)
Patient came in today to pick up custom made foot orthotics.  The goals were accomplished and the patient reported no dissatisfaction with said orthotics.  Patient was advised of breakin period and how to report any issues. 

## 2020-03-11 ENCOUNTER — Other Ambulatory Visit: Payer: Self-pay | Admitting: Podiatry

## 2020-03-11 NOTE — Telephone Encounter (Signed)
Please advise 

## 2020-07-08 ENCOUNTER — Other Ambulatory Visit: Payer: Self-pay | Admitting: Podiatry

## 2020-07-22 ENCOUNTER — Other Ambulatory Visit: Payer: Self-pay | Admitting: Internal Medicine

## 2020-07-22 DIAGNOSIS — Z87891 Personal history of nicotine dependence: Secondary | ICD-10-CM

## 2021-08-05 ENCOUNTER — Other Ambulatory Visit: Payer: Self-pay | Admitting: Internal Medicine

## 2021-08-05 DIAGNOSIS — E7800 Pure hypercholesterolemia, unspecified: Secondary | ICD-10-CM

## 2021-09-03 ENCOUNTER — Other Ambulatory Visit: Payer: 59

## 2021-09-04 ENCOUNTER — Ambulatory Visit
Admission: RE | Admit: 2021-09-04 | Discharge: 2021-09-04 | Disposition: A | Discharge status: Home / Self Care | Payer: No Typology Code available for payment source | Source: Ambulatory Visit | Attending: Internal Medicine | Admitting: Internal Medicine

## 2021-09-04 DIAGNOSIS — E7800 Pure hypercholesterolemia, unspecified: Secondary | ICD-10-CM

## 2021-12-02 ENCOUNTER — Encounter: Payer: Self-pay | Admitting: Internal Medicine

## 2021-12-10 ENCOUNTER — Encounter: Payer: Self-pay | Admitting: Internal Medicine

## 2022-01-01 ENCOUNTER — Ambulatory Visit (AMBULATORY_SURGERY_CENTER): Payer: Self-pay

## 2022-01-01 VITALS — Ht 64.0 in | Wt 133.0 lb

## 2022-01-01 DIAGNOSIS — Z86010 Personal history of colonic polyps: Secondary | ICD-10-CM

## 2022-01-01 MED ORDER — NA SULFATE-K SULFATE-MG SULF 17.5-3.13-1.6 GM/177ML PO SOLN: 1.0000 | ORAL | 0 refills | Status: DC

## 2022-01-01 NOTE — Progress Notes (Signed)
No egg or soy allergy known to patient  Large amount of eggs cause stomach spasms. She states no problem with propofol for her colonoscopies in the past. No issues known to pt with past sedation with any surgeries or procedures Patient denies ever being told they had issues or difficulty with intubation  No FH of Malignant Hyperthermia Pt is not on diet pills Pt is not on  home 02  Pt is not on blood thinners  Pt denies issues with constipation  No A fib or A flutter Have any cardiac testing pending--denied Pt instructed to use Single[employer].com or GoodRx for a price reduction on prep

## 2022-01-21 ENCOUNTER — Encounter: Payer: Self-pay | Admitting: Internal Medicine

## 2022-01-29 ENCOUNTER — Ambulatory Visit (AMBULATORY_SURGERY_CENTER): Payer: 59 | Admitting: Internal Medicine

## 2022-01-29 ENCOUNTER — Encounter: Payer: Self-pay | Admitting: Internal Medicine

## 2022-01-29 VITALS — BP 128/65 | HR 61 | Temp 97.3°F | Resp 12 | Ht 64.0 in | Wt 133.0 lb

## 2022-01-29 DIAGNOSIS — Z86010 Personal history of colonic polyps: Secondary | ICD-10-CM

## 2022-01-29 DIAGNOSIS — Z09 Encounter for follow-up examination after completed treatment for conditions other than malignant neoplasm: Secondary | ICD-10-CM

## 2022-01-29 MED ORDER — SODIUM CHLORIDE 0.9 % IV SOLN: 500.0000 mL | Freq: Once | INTRAVENOUS | Status: DC

## 2022-01-29 NOTE — Patient Instructions (Signed)
Handout provided about diverticulosis.  Resume previous diet.  Continue present medications.  Repeat colonoscopy in 5 years for surveillance.   YOU HAD AN ENDOSCOPIC PROCEDURE TODAY AT [facility]:   Refer to the procedure report that was given to you for any specific questions about what was found during the examination.  If the procedure report does not answer your questions, please call your gastroenterologist to clarify.  If you requested that your care partner not be given the details of your procedure findings, then the procedure report has been included in a sealed envelope for you to review at your convenience later.  YOU SHOULD EXPECT: Some feelings of bloating in the abdomen. Passage of more gas than usual.  Walking can help get rid of the air that was put into your GI tract during the procedure and reduce the bloating. If you had a lower endoscopy (such as a colonoscopy or flexible sigmoidoscopy) you may notice spotting of blood in your stool or on the toilet paper. If you underwent a bowel prep for your procedure, you may not have a normal bowel movement for a few days.  Please Note:  You might notice some irritation and congestion in your nose or some drainage.  This is from the oxygen used during your procedure.  There is no need for concern and it should clear up in a day or so.  SYMPTOMS TO REPORT IMMEDIATELY:  Following lower endoscopy (colonoscopy or flexible sigmoidoscopy):  Excessive amounts of blood in the stool  Significant tenderness or worsening of abdominal pains  Swelling of the abdomen that is new, acute  Fever of 100F or higher  For urgent or emergent issues, a gastroenterologist can be reached at any hour by calling [phone]. Do not use MyChart messaging for urgent concerns.    DIET:  We do recommend a small meal at first, but then you may proceed to your regular diet.  Drink plenty of fluids but you should avoid alcoholic beverages for 24  hours.  ACTIVITY:  You should plan to take it easy for the rest of today and you should NOT DRIVE or use heavy machinery until tomorrow (because of the sedation medicines used during the test).    FOLLOW UP: Our staff will call the number listed on your records the next business day following your procedure.  We will call around 7:15- 8:00 am to check on you and address any questions or concerns that you may have regarding the information given to you following your procedure. If we do not reach you, we will leave a message.     If any biopsies were taken you will be contacted by phone or by letter within the next 1-3 weeks.  Please call us at [phone] if you have not heard about the biopsies in 3 weeks.    SIGNATURES/CONFIDENTIALITY: You and/or your care partner have signed paperwork which will be entered into your electronic medical record.  These signatures attest to the fact that that the information above on your After Visit Summary has been reviewed and is understood.  Full responsibility of the confidentiality of this discharge information lies with you and/or your care-partner.

## 2022-01-29 NOTE — Progress Notes (Signed)
HISTORY OF PRESENT ILLNESS:  Sarah Chase is a 66 y.o. female with a history of multiple advanced adenomatous colon polyps.  Presents today for surveillance colonoscopy  REVIEW OF SYSTEMS:  All non-GI ROS negative. Past Medical History:  Diagnosis Date   Cancer ([place])    basal cell x3 / on nose   Hx of colonic polyps     Past Surgical History:  Procedure Laterality Date   biopsy of cyst in bilateral breasts     COLONOSCOPY     SKIN CANCER EXCISION     x 3 nose    UPPER GASTROINTESTINAL ENDOSCOPY     WISDOM TOOTH EXTRACTION      Social History Crystale Giannattasio  reports that she quit smoking about 4 years ago. Her smoking use included cigarettes. She smoked an average of 1 pack per day. She has never used smokeless tobacco. She reports current alcohol use of about 6.0 - 12.0 standard drinks of alcohol per week. She reports that she does not use drugs.  family history includes Colon cancer (age of onset: 4) in her father; Colon polyps in her sister; Leukemia in her mother; Stomach cancer in her maternal aunt and paternal aunt.  Allergies  Allergen Reactions   Codeine     Stomach and colon spasms   Eggs Or Egg-Derived Products     Large amt eggs cause stomach spasms.       PHYSICAL EXAMINATION: Vital signs: BP 124/71   Pulse 91   Temp (!) 97.3 F (36.3 C) (Skin)   Ht '5\' 4"'$  (1.626 m)   Wt 133 lb (60.3 kg)   SpO2 98%   BMI 22.83 kg/m  General: Well-developed, well-nourished, no acute distress HEENT: Sclerae are anicteric, conjunctiva pink. Oral mucosa intact Lungs: Clear Heart: Regular Abdomen: soft, nontender, nondistended, no obvious ascites, no peritoneal signs, normal bowel sounds. No organomegaly. Extremities: No edema Psychiatric: alert and oriented x3. Cooperative      ASSESSMENT:  Personal history of multiple advanced adenomatous colon polyps.  Presents today for surveillance colonoscopy   PLAN:  Surveillance colonoscopy

## 2022-01-29 NOTE — Op Note (Signed)
[place] Patient Name: Sarah Chase Procedure Date: 01/29/2022 2:33 PM MRN: [id number] Endoscopist: Docia Chuck. Henrene Pastor , MD Age: 66 Referring MD:  Date of Birth: Sep 09, 1955 Gender: Female Account #: [id number] Procedure:                Colonoscopy Indications:              High risk colon cancer surveillance: Personal                            history of adenoma (10 mm or greater in size), High                            risk colon cancer surveillance: Personal history of                            multiple (3 or more) adenomas. Previous                            examinations 2014, 2014, 2015, 2020 Medicines:                Monitored Anesthesia Care Procedure:                Pre-Anesthesia Assessment:                           - Prior to the procedure, a History and Physical                            was performed, and patient medications and                            allergies were reviewed. The patient's tolerance of                            previous anesthesia was also reviewed. The risks                            and benefits of the procedure and the sedation                            options and risks were discussed with the patient.                            All questions were answered, and informed consent                            was obtained. Prior Anticoagulants: The patient has                            taken no previous anticoagulant or antiplatelet                            agents. ASA Grade Assessment: II - A patient with  mild systemic disease. After reviewing the risks                            and benefits, the patient was deemed in                            satisfactory condition to undergo the procedure.                           After obtaining informed consent, the colonoscope                            was passed under direct vision. Throughout the                            procedure, the patient's blood  pressure, pulse, and                            oxygen saturations were monitored continuously. The                            Olympus Scope [id number] was introduced through the                            anus and advanced to the the cecum, identified by                            appendiceal orifice and ileocecal valve. The                            ileocecal valve, appendiceal orifice, and rectum                            were photographed. The quality of the bowel                            preparation was excellent. The colonoscopy was                            performed without difficulty. The patient tolerated                            the procedure well. The bowel preparation used was                            SUPREP via split dose instruction. Scope In: 2:50:33 PM Scope Out: 3:00:58 PM Scope Withdrawal Time: 0 hours 6 minutes 38 seconds  Total Procedure Duration: 0 hours 10 minutes 25 seconds  Findings:                 Multiple small and large-mouthed diverticula were                            found in the entire colon.  The exam was otherwise without abnormality on                            direct and retroflexion views. Previous transverse                            colon marking tattoo noted. Complications:            No immediate complications. Estimated blood loss:                            None. Estimated Blood Loss:     Estimated blood loss: none. Impression:               - Diverticulosis in the entire examined colon.                           - The examination was otherwise normal on direct                            and retroflexion views.                           - No specimens collected. Recommendation:           - Repeat colonoscopy in 5 years for surveillance.                           - Patient has a contact number available for                            emergencies. The signs and symptoms of potential                             delayed complications were discussed with the                            patient. Return to normal activities tomorrow.                            Written discharge instructions were provided to the                            patient.                           - Resume previous diet.                           - Continue present medications. Docia Chuck. Henrene Pastor, MD 01/29/2022 3:07:42 PM This report has been signed electronically.

## 2022-01-29 NOTE — Progress Notes (Signed)
Pt's states no medical or surgical changes since previsit or office visit. 

## 2022-01-29 NOTE — Progress Notes (Signed)
Pt resting comfortably. VSS. Airway intact. SBAR complete to RN. All questions answered.   

## 2022-02-01 ENCOUNTER — Telehealth: Payer: Self-pay | Admitting: *Deleted

## 2022-02-01 NOTE — Telephone Encounter (Signed)
  Follow up Call-     01/29/2022    2:33 PM  Call back number  Post procedure Call Back phone  # [phone]  Permission to leave phone message Yes     Patient questions:  Do you have a fever, pain , or abdominal swelling? No. Pain Score  0 *  Have you tolerated food without any problems? Yes.    Have you been able to return to your normal activities? Yes.    Do you have any questions about your discharge instructions: Diet   No. Medications  No. Follow up visit  No.  Do you have questions or concerns about your Care? No.  Actions: * If pain score is 4 or above: No action needed, pain <4.

## 2022-07-29 ENCOUNTER — Ambulatory Visit: Payer: 59 | Admitting: Podiatry

## 2023-04-04 ENCOUNTER — Emergency Department (HOSPITAL_COMMUNITY): Payer: 59

## 2023-04-04 ENCOUNTER — Emergency Department (HOSPITAL_COMMUNITY)
Admission: EM | Admit: 2023-04-04 | Discharge: 2023-04-04 | Disposition: A | Discharge status: Home / Self Care | Payer: 59 | Attending: Emergency Medicine | Admitting: Emergency Medicine

## 2023-04-04 ENCOUNTER — Other Ambulatory Visit: Payer: Self-pay

## 2023-04-04 ENCOUNTER — Encounter (HOSPITAL_COMMUNITY): Payer: Self-pay

## 2023-04-04 DIAGNOSIS — R1900 Intra-abdominal and pelvic swelling, mass and lump, unspecified site: Secondary | ICD-10-CM | POA: Diagnosis not present

## 2023-04-04 DIAGNOSIS — R1031 Right lower quadrant pain: Secondary | ICD-10-CM | POA: Diagnosis present

## 2023-04-04 LAB — URINALYSIS, ROUTINE W REFLEX MICROSCOPIC
Bilirubin Urine: NEGATIVE
Glucose, UA: NEGATIVE mg/dL
Hgb urine dipstick: NEGATIVE
Ketones, ur: 5 mg/dL — AB
Leukocytes,Ua: NEGATIVE
Nitrite: NEGATIVE
Protein, ur: NEGATIVE mg/dL
Specific Gravity, Urine: 1.01 (ref 1.005–1.030)
pH: 5 (ref 5.0–8.0)

## 2023-04-04 LAB — CBC
HCT: 43.6 % (ref 36.0–46.0)
Hemoglobin: 14.2 g/dL (ref 12.0–15.0)
MCH: 31.4 pg (ref 26.0–34.0)
MCHC: 32.6 g/dL (ref 30.0–36.0)
MCV: 96.5 fL (ref 80.0–100.0)
Platelets: 308 10*3/uL (ref 150–400)
RBC: 4.52 MIL/uL (ref 3.87–5.11)
RDW: 12.6 % (ref 11.5–15.5)
WBC: 7.3 10*3/uL (ref 4.0–10.5)
nRBC: 0 % (ref 0.0–0.2)

## 2023-04-04 LAB — COMPREHENSIVE METABOLIC PANEL
ALT: 13 U/L (ref 0–44)
AST: 20 U/L (ref 15–41)
Albumin: 4 g/dL (ref 3.5–5.0)
Alkaline Phosphatase: 43 U/L (ref 38–126)
Anion gap: 13 (ref 5–15)
BUN: 12 mg/dL (ref 8–23)
CO2: 22 mmol/L (ref 22–32)
Calcium: 9.6 mg/dL (ref 8.9–10.3)
Chloride: 103 mmol/L (ref 98–111)
Creatinine, Ser: 0.89 mg/dL (ref 0.44–1.00)
GFR, Estimated: >60 mL/min (ref >60)
Glucose, Bld: 131 mg/dL — ABNORMAL HIGH (ref 70–99)
Potassium: 3.8 mmol/L (ref 3.5–5.1)
Sodium: 138 mmol/L (ref 135–145)
Total Bilirubin: 0.8 mg/dL (ref <1.2)
Total Protein: 7.2 g/dL (ref 6.5–8.1)

## 2023-04-04 LAB — LIPASE, BLOOD: Lipase: 32 U/L (ref 11–51)

## 2023-04-04 MED ORDER — FENTANYL CITRATE PF 50 MCG/ML IJ SOSY
50.0000 ug | PREFILLED_SYRINGE | Freq: Once | INTRAMUSCULAR | Status: AC
  Administered 2023-04-04: 50 ug via INTRAVENOUS
  Filled 2023-04-04: qty 1

## 2023-04-04 MED ORDER — OXYCODONE-ACETAMINOPHEN 5-325 MG PO TABS
1.0000 | ORAL_TABLET | Freq: Once | ORAL | Status: AC
  Administered 2023-04-04: 1 via ORAL
  Filled 2023-04-04: qty 1

## 2023-04-04 MED ORDER — ONDANSETRON HCL 4 MG/2ML IJ SOLN
4.0000 mg | Freq: Once | INTRAMUSCULAR | Status: AC
  Administered 2023-04-04: 4 mg via INTRAVENOUS
  Filled 2023-04-04: qty 2

## 2023-04-04 MED ORDER — ONDANSETRON 4 MG PO TBDP: 4.0000 mg | ORAL_TABLET | Freq: Three times a day (TID) | ORAL | 0 refills | Status: DC | PRN

## 2023-04-04 MED ORDER — IOHEXOL 350 MG/ML SOLN
75.0000 mL | Freq: Once | INTRAVENOUS | Status: AC | PRN
  Administered 2023-04-04: 75 mL via INTRAVENOUS

## 2023-04-04 MED ORDER — OXYCODONE-ACETAMINOPHEN 5-325 MG PO TABS: 1.0000 | ORAL_TABLET | Freq: Four times a day (QID) | ORAL | 0 refills | Status: DC | PRN

## 2023-04-04 NOTE — ED Triage Notes (Signed)
Patient reports RLQ abdominal pain that started yesterday. States she vomited right before coming to the ER. States this has not happened before. Denies diarrhea or constipation. No blood thinners.

## 2023-04-04 NOTE — Discharge Instructions (Signed)
We recommend close follow up with Gynecology Oncology in the clinic to discuss next steps and surgical removal of  your mass. Take Percocet as prescribed for pain control.  Do not drive or drink alcohol after taking this medication as it may make you drowsy and impair your judgment.  Use Zofran as prescribed for management of nausea and vomiting.  Should you experience uncontrolled pain despite medications, persistent nausea and vomiting unrelieved with Zofran, or other concerning symptoms, promptly return to the ED for repeat evaluation.

## 2023-04-04 NOTE — ED Notes (Signed)
Patient transported to Ultrasound 

## 2023-04-04 NOTE — ED Provider Notes (Signed)
[facility] AT [facility] Provider Note   CSN: [id number] Arrival date & time: 04/04/23  0136     History  Chief Complaint  Patient presents with   Abdominal Pain    Sarah Chase is a 67 y.o. female.  67 year old female presents to the emergency department for evaluation of lower abdominal pain.  She reports pain in her right lower quadrant which began yesterday suddenly.  Worsening pain has caused her increased nausea.  She did have 1 episode of emesis prior to ED arrival this evening.  States that her pain is remained constant and is waxing and waning in severity.  It is without any known modifying factors.  She has not taken any medications for her symptoms.  No fevers, dysuria, hematuria, diarrhea, melena or hematochezia.  Denies a history of prior abdominal surgeries.  Is not on chronic anticoagulation.  The history is provided by the patient. No language interpreter was used.  Abdominal Pain      Home Medications Prior to Admission medications   Medication Sig Start Date End Date Taking? Authorizing Provider  ondansetron (ZOFRAN-ODT) 4 MG disintegrating tablet Take 1 tablet (4 mg total) by mouth every 8 (eight) hours as needed for nausea or vomiting. 04/04/23  Yes Antony Madura, PA-C  oxyCODONE-acetaminophen (PERCOCET/ROXICET) 5-325 MG tablet Take 1 tablet by mouth every 6 (six) hours as needed for severe pain (pain score 7-10) or moderate pain (pain score 4-6). 04/04/23  Yes Antony Madura, PA-C  buPROPion (WELLBUTRIN XL) 150 MG 24 hr tablet Take 150 mg by mouth daily. 11/30/18   [provider]  meloxicam (MOBIC) 15 MG tablet TAKE 1 TABLET BY MOUTH EVERY DAY 03/11/20   Hyatt, Max T, DPM      Allergies    Codeine and Egg-derived products    Review of Systems   Review of Systems  Gastrointestinal:  Positive for abdominal pain.  Ten systems reviewed and are negative for acute change, except as noted in the HPI.    Physical  Exam Updated Vital Signs BP 125/61   Pulse 91   Temp 97.7 F (36.5 C) (Oral)   Resp 16   Ht 5\' 4"  (1.626 m)   Wt 61.2 kg   SpO2 99%   BMI 23.17 kg/m   Physical Exam Vitals and nursing note reviewed.  Constitutional:      General: She is not in acute distress.    Appearance: She is well-developed. She is not diaphoretic.     Comments: Nontoxic appearing and in NAD  HENT:     Head: Normocephalic and atraumatic.  Eyes:     General: No scleral icterus.    Conjunctiva/sclera: Conjunctivae normal.  Cardiovascular:     Rate and Rhythm: Normal rate and regular rhythm.     Pulses: Normal pulses.  Pulmonary:     Effort: Pulmonary effort is normal. No respiratory distress.     Breath sounds: No stridor. No wheezing.     Comments: Respirations even and unlabored Abdominal:     General: There is no distension.     Palpations: Abdomen is soft. There is no mass.     Tenderness: There is abdominal tenderness.     Comments: Abdomen soft, nondistended. TTP in the RLQ and suprapubic abdomen. Mild voluntary guarding. No peritoneal signs, masses.  Musculoskeletal:        General: Normal range of motion.     Cervical back: Normal range of motion.  Skin:    General: Skin  is warm and dry.     Coloration: Skin is not pale.     Findings: No erythema or rash.  Neurological:     Mental Status: She is alert and oriented to person, place, and time.     Coordination: Coordination normal.  Psychiatric:        Behavior: Behavior normal.     ED Results / Procedures / Treatments   Labs (all labs ordered are listed, but only abnormal results are displayed) Labs Reviewed  COMPREHENSIVE METABOLIC PANEL - Abnormal; Notable for the following components:      Result Value   Glucose, Bld 131 (*)    All other components within normal limits  URINALYSIS, ROUTINE W REFLEX MICROSCOPIC - Abnormal; Notable for the following components:   Ketones, ur 5 (*)    All other components within normal limits   LIPASE, BLOOD  CBC  OVARIAN MALIGNANCY RISK-ROMA    EKG None  Radiology US PELVIC TRANSABD W/PELVIC DOPPLER Result Date: 04/04/2023 CLINICAL DATA:  67 year old female with history of cystic pelvic lesion on CT examination. Follow-up study. EXAM: TRANSABDOMINAL AND TRANSVAGINAL ULTRASOUND OF PELVIS DOPPLER ULTRASOUND OF OVARIES TECHNIQUE: Both transabdominal and transvaginal ultrasound examinations of the pelvis were performed. Transabdominal technique was performed for global imaging of the pelvis including uterus, ovaries, adnexal regions, and pelvic cul-de-sac. It was necessary to proceed with endovaginal exam following the transabdominal exam to visualize the adnexal regions. Color and duplex Doppler ultrasound was utilized to evaluate blood flow to the ovaries. COMPARISON:  No prior pelvic ultrasound. CT of the abdomen and pelvis 04/04/2023. FINDINGS: Comment: Attempts were made at transvaginal imaging, but were not tolerable by the patient. Uterus Retroverted and poorly visualized on today's examination, estimated to measure approximately 7.1 x 3.5 cm. Endometrium Could not be assessed. Ovaries: A complex cystic mass was noted in the midline of the anatomic pelvis. Despite multiple attempts to discriminate the origin of the mass, it is uncertain whether this arises from the right or the left ovary. Overall, the extent of ovarian tissue in the midline of the pelvis was estimated to measure approximately 10.3 x 8.6 x 11.6 cm, and demonstrated normal Doppler flow within the ovarian tissue. The dominant cystic lesion within this ovarian tissue is estimated to measure approximately 8.4 x 9.0 x 9.7 cm with multiple thickened irregular internal septations. Blood flow could not be confirmed within the thick internal septations of this lesion. Other findings No abnormal free fluid. IMPRESSION: 1. Limited study which redemonstrated a large complex cystic mass in the central pelvis estimated to measure  approximately 8.4 x 9.0 x 9.7 cm. The origin of this is presumably ovarian, although whether this arises from the left or right ovary is uncertain. The ovarian tissue around the periphery of the lesion appears to perfuse normally under Doppler interrogation, although the dominant lesion with thick internal septations does not appear to demonstrate internal flow. Further clinical evaluation by OB-Gyn is recommended to assess for potential neoplasm. Additionally, given the lack of internal blood flow within the lesion, assessment for potential torsion is suggested (although the presence of flow within the normal-appearing ovarian tissue makes frank ovarian torsion unlikely). Electronically Signed   By: Trudie Reed M.D.   On: 04/04/2023 05:46   CT ABDOMEN PELVIS W CONTRAST Result Date: 04/04/2023 CLINICAL DATA:  Right lower quadrant pain EXAM: CT ABDOMEN AND PELVIS WITH CONTRAST TECHNIQUE: Multidetector CT imaging of the abdomen and pelvis was performed using the standard protocol following bolus administration of intravenous  contrast. RADIATION DOSE REDUCTION: This exam was performed according to the departmental dose-optimization program which includes automated exposure control, adjustment of the mA and/or kV according to patient size and/or use of iterative reconstruction technique. CONTRAST:  75mL OMNIPAQUE IOHEXOL 350 MG/ML SOLN COMPARISON:  None Available. FINDINGS: Lower chest: No acute abnormality Hepatobiliary: No focal hepatic abnormality. Gallbladder unremarkable. Pancreas: No focal abnormality or ductal dilatation. Spleen: No focal abnormality.  Normal size. Adrenals/Urinary Tract: Left lower pole parapelvic cysts. No follow-up imaging recommended. No suspicious renal or adrenal lesion. No hydronephrosis. Urinary bladder unremarkable. Stomach/Bowel: Normal appendix. Left colonic diverticulosis. No active diverticulitis. Multiple duodenal diverticulum in the region of the 2/3 portion of the  duodenum. No bowel obstruction. Stomach unremarkable. Vascular/Lymphatic: No evidence of aneurysm or adenopathy. Reproductive: Uterus unremarkable. Large complex cystic mass in the midline of the pelvis superior to the uterus measuring 11.2 x 9.2 cm. Internal septations noted. Other: No free fluid or free air. Musculoskeletal: No acute bony abnormality. IMPRESSION: Large complex cystic mass in the midline of the pelvis measuring 11.2 x 9.2 cm with internal septations. This is concerning for ovarian cystadenoma or cystadenocarcinoma. Recommend gynecologic consultation. Left colonic diverticulosis.  No active diverticulitis. Electronically Signed   By: Charlett Nose M.D.   On: 04/04/2023 03:23    Procedures Procedures    Medications Ordered in ED Medications  fentaNYL (SUBLIMAZE) injection 50 mcg (50 mcg Intravenous Given 04/04/23 0250)  ondansetron (ZOFRAN) injection 4 mg (4 mg Intravenous Given 04/04/23 0249)  iohexol (OMNIPAQUE) 350 MG/ML injection 75 mL (75 mLs Intravenous Contrast Given 04/04/23 0301)  fentaNYL (SUBLIMAZE) injection 50 mcg (50 mcg Intravenous Given 04/04/23 0102)    ED Course/ Medical Decision Making/ A&P Clinical Course as of 04/04/23 0615  Mon Apr 04, 2023  0335 Consultation to OBGYN regarding imaging results. Dr. Donavan Foil scrubbed into C-section. Message sent regarding next steps. Pending call back with recommendations. [KH]  0406 US pelvis ordered to exclude torsion given waxing/waning ongoing pain. OBGYN consultation pending. [KH]  [phone] Spoke with Dr. Donavan Foil of OBGYN - agrees with plan for ultrasound; however, if ovarian torsion is present, unlikely to warrant emergent surgery in this post-menopausal female. Internal septations are worrisome for cancer. Would add on a ROMA test or CA125 if able. Dr. Donavan Foil also recommends consultation to GYN-Oncology to discuss plans/next steps if US findings remain concern for carcinoma.  [KH]    Clinical Course User Index [KH] Antony Madura,  PA-C                                 Medical Decision Making Amount and/or Complexity of Data Reviewed Labs: ordered. Radiology: ordered.  Risk Prescription drug management.   This patient presents to the ED for concern of RLQ abdominal pain, this involves an extensive number of treatment options, and is a complaint that carries with it a high risk of complications and morbidity.  The differential diagnosis includes appendicitis vs kidney stone vs pyelonephritis vs ovarian torsion vs pelvic mass vs pSBO/SBO vs ruptured viscous.   Co morbidities that complicate the patient evaluation  Basal cell CA   Additional history obtained:  Additional history obtained from friend, at bedside   Lab Tests:  I Ordered, and personally interpreted labs.  The pertinent results include:  CBC, CMP, Lipase, UA WNL. ROMA pending.   Imaging Studies ordered:  I ordered imaging studies including CT abd/pelvis and US pelvis  I independently visualized and  interpreted imaging which showed large pelvic mass, presumably cystic, concerning for cancerous process. I agree with the radiologist interpretation   Cardiac Monitoring:  The patient was maintained on a cardiac monitor.  I personally viewed and interpreted the cardiac monitored which showed an underlying rhythm of: NSR   Medicines ordered and prescription drug management:  I ordered medication including Fentanyl for pain and Zofran for nausea  Reevaluation of the patient after these medicines showed that the patient improved I have reviewed the patients home medicines and have made adjustments as needed   Consultations Obtained:  I requested consultation with Dr. Chestine Spore of GYN-Oncology and discussed lab and imaging findings as well as pertinent plan - they recommend: close f/u in the Gyn/Onc clinic for further evaluation and management.   Problem List / ED Course:  As above   Reevaluation:  After the interventions noted above, I  reevaluated the patient and found that they have :stayed the same   Social Determinants of Health:  Lives independently    Dispostion:  After consideration of the diagnostic results and the patients response to treatment, I feel that the patent would benefit from close f/u with GYN/Onc in clinic. Ambulatory referral placed. Patient given Rx for Percocet for pain control. Return precautions discussed and provided. Patient discharged in stable condition with no unaddressed concerns.         Final Clinical Impression(s) / ED Diagnoses Final diagnoses:  Pelvic mass    Rx / DC Orders ED Discharge Orders          Ordered    Ambulatory referral to Gynecologic Oncology        04/04/23 0612    oxyCODONE-acetaminophen (PERCOCET/ROXICET) 5-325 MG tablet  Every 6 hours PRN        04/04/23 0612    ondansetron (ZOFRAN-ODT) 4 MG disintegrating tablet  Every 8 hours PRN        04/04/23 0612              Antony Madura, PA-C 04/04/23 1610    Zadie Rhine, MD 04/04/23 [phone]

## 2023-04-05 LAB — OVARIAN MALIGNANCY RISK-ROMA
Cancer Antigen (CA) 125: 9.5 U/mL (ref 0.0–38.1)
HE4: 63.7 pmol/L (ref 0.0–96.5)
Postmenopausal ROMA: 1.07
Premenopausal ROMA: 1.22 — ABNORMAL HIGH

## 2023-04-05 LAB — POSTMENOPAUSAL INTERP: LOW

## 2023-04-05 LAB — PREMENOPAUSAL INTERP: HIGH

## 2023-04-07 ENCOUNTER — Encounter: Payer: Self-pay | Admitting: Gynecologic Oncology

## 2023-04-07 ENCOUNTER — Inpatient Hospital Stay: Payer: 59 | Attending: Gynecologic Oncology | Admitting: Gynecologic Oncology

## 2023-04-07 ENCOUNTER — Other Ambulatory Visit (HOSPITAL_COMMUNITY)
Admission: RE | Admit: 2023-04-07 | Discharge: 2023-04-07 | Disposition: A | Discharge status: Home / Self Care | Payer: 59 | Source: Ambulatory Visit | Attending: Gynecologic Oncology | Admitting: Gynecologic Oncology

## 2023-04-07 VITALS — BP 142/78 | HR 81 | Temp 97.4°F | Resp 18 | Ht 64.0 in | Wt 132.6 lb

## 2023-04-07 DIAGNOSIS — D398 Neoplasm of uncertain behavior of other specified female genital organs: Secondary | ICD-10-CM | POA: Insufficient documentation

## 2023-04-07 DIAGNOSIS — Z79899 Other long term (current) drug therapy: Secondary | ICD-10-CM | POA: Insufficient documentation

## 2023-04-07 DIAGNOSIS — Z791 Long term (current) use of non-steroidal anti-inflammatories (NSAID): Secondary | ICD-10-CM | POA: Insufficient documentation

## 2023-04-07 DIAGNOSIS — Z124 Encounter for screening for malignant neoplasm of cervix: Secondary | ICD-10-CM | POA: Insufficient documentation

## 2023-04-07 DIAGNOSIS — Z800 Family history of malignant neoplasm of digestive organs: Secondary | ICD-10-CM | POA: Insufficient documentation

## 2023-04-07 DIAGNOSIS — F32A Depression, unspecified: Secondary | ICD-10-CM | POA: Insufficient documentation

## 2023-04-07 DIAGNOSIS — Z860100 Personal history of colon polyps, unspecified: Secondary | ICD-10-CM | POA: Insufficient documentation

## 2023-04-07 DIAGNOSIS — Z01812 Encounter for preprocedural laboratory examination: Secondary | ICD-10-CM | POA: Insufficient documentation

## 2023-04-07 DIAGNOSIS — N9489 Other specified conditions associated with female genital organs and menstrual cycle: Secondary | ICD-10-CM | POA: Insufficient documentation

## 2023-04-07 DIAGNOSIS — R102 Pelvic and perineal pain: Secondary | ICD-10-CM | POA: Diagnosis not present

## 2023-04-07 DIAGNOSIS — Z87891 Personal history of nicotine dependence: Secondary | ICD-10-CM

## 2023-04-07 DIAGNOSIS — Z85828 Personal history of other malignant neoplasm of skin: Secondary | ICD-10-CM | POA: Insufficient documentation

## 2023-04-07 DIAGNOSIS — Z806 Family history of leukemia: Secondary | ICD-10-CM | POA: Insufficient documentation

## 2023-04-07 NOTE — Progress Notes (Signed)
GYNECOLOGIC ONCOLOGY NEW PATIENT CONSULTATION   Patient Name: Sarah Chase  Patient Age: 67 y.o. Date of Service: 04/07/23 Referring Provider: Antony Madura, PA-C  Primary Care Provider: Gaspar Garbe, MD Consulting Provider: Eugene Garnet, MD   Assessment/Plan:  Postmenopausal patient with complex adnexal mass.  We discussed recent symptoms as well as imaging from the emergency department.  Her acute onset of pain in the setting of a large adnexal mass are suggestive of ovarian torsion.  I suspect that the appearance of the mass may be in part related to this diagnosis.  We reviewed that tumor markers including a Roma are suggestive of a benign process although not diagnostic.  Although symptoms improved, given continued symptoms as well as the size and appearance of this mass, I recommend proceeding with both diagnostic and therapeutic surgery.  Recommend robotic bilateral salpingo-oophorectomy in the setting of menopause with plan for contained decompression of the adnexal mass and sending it for frozen section.  If benign, no additional surgery would be performed and the patient's preference is not to undergo hysterectomy concurrently unless indicated.  In the setting of a borderline tumor, we discussed procedures including +/- total hysterectomy, peritoneal biopsies, and omentectomy.  If ovarian cancer encountered, we discussed the addition of lymphadenectomy.  Patient is overdue for cervical cancer screening.  Although 67, she has not had a Pap test in likely 10 years.  This was performed today.  We reviewed the plan for a robotic assisted bilateral salpingo-oophorectomy, possible total hysterectomy, possible staging, possible laparotomy. The risks of surgery include infection but are not limited to wound separation; hernia; vaginal cuff separation if hysterectomy performed, injury to adjacent organs such as bowel, bladder, blood vessels, ureters and nerves; bleeding which may  require blood transfusion; anesthesia risk; thromboembolic events; possible death; unforeseen complications; possible need for re-exploration; medical complications such as heart attack, stroke, pleural effusion and pneumonia; and, if full lymphadenectomy is performed the risk of lymphedema and lymphocyst. The patient will receive DVT and antibiotic prophylaxis as indicated. She voiced a clear understanding. She had the opportunity to ask questions.   We will tentatively plan on surgery on January 8 or 15 although the patient knows there is a possibility we would be able to do surgery sooner (1/2).  I office will reach out tomorrow or Monday to let her know.  Reoperative instructions will be reviewed with her by phone and sent to her in MyChart.  If ovarian cancer diagnosed at the time of surgery, we discussed the need for short-term prophylactic anticoagulation.  A copy of this note was sent to the patient's referring provider.   65 minutes of total time was spent for this patient encounter, including preparation, face-to-face counseling with the patient and coordination of care, and documentation of the encounter.  Eugene Garnet, MD  Division of Gynecologic Oncology  Department of Obstetrics and Gynecology  [facility]  ___________________________________________  Chief Complaint: No chief complaint on file.   History of Present Illness:  Sarah Chase is a 67 y.o. y.o. female who is seen in consultation at the request of Antony Madura, PA-C for an evaluation of a complex adnexal mass.  The patient endorses developing sudden right pelvic pain on Sunday while she was out doing some Christmas shopping.  She describes the pain as sharp.  It would initially come and go but there was always some baseline pain.  She rested a lot that day.  The pain got significantly worse at night prompting  her to come in to the emergency department.  She had some nausea during this  time and had 1 episode of emesis on Sunday evening.  She notes now having some very mild and occasional pain on the right previous to Sunday when she was walking.  She would also have episodes of pain along her left lateral abdomen and pelvis that she had previously been told were due to overexertion.  Work-up to date: The patient presented to the emergency department on 04/04/2023 with lower abdominal pain beginning suddenly on the preceding day.  Worsening pain causing nausea.  She had 1 episode of emesis prior to presenting to the emergency department.  Paining was described as constant and waxing and waning in severity.  Pain able to be managed during this visit with plan for close outpatient follow-up. CT abdomen/pelvis on 04/04/2023: Complex cystic mass in the midline of the pelvis superior to the uterus measures 11.2 x 9.2 cm with internal septations.  No free fluid.  Diverticulosis of the left colon noted without diverticulitis.  Multiple duodenal diverticulum noted. Pelvic ultrasound exam on 04/04/2023: Uterus measures 7.1 x 3.5 cm, retroverted and poorly visualized.  Complex cystic mass noted in the midline pelvis, uncertain whether it arises from the right or left ovary.  Estimated to measure 10.3 x 8.6 x 11.6 cm.  Dominant cystic lesion estimated to measure up to 9.7 cm with multiple thickened irregular internal septations.  No abnormal free fluid.  No blood flow demonstrated within the thick internal septations. Tumor markers on 04/04/23: CA-125: 9.5 HE4: 63.7 Postmenopausal ROMA: 1.07 (low risk)  The patient presents with her friend and house mate, Sarah Dandy, today.   Since being seen in the emergency department, pain continues to be intermittent although less painful.  She has not taking any of the narcotic she was given but instead uses 2 extra strength Tylenol which provides sufficient relief.  She has periods of time where she does not have pain at all.  She denies any further nausea or  emesis.  She endorses a good appetite.  She reports normal bowel and bladder function.  He denies any recent changes in weight.  Patient is in the process of retiring with a retirement date of February 1.  PAST MEDICAL HISTORY:  Past Medical History:  Diagnosis Date   Cancer (HCC)    basal cell x3 / on nose   Depression    Hx of colonic polyps    Plantar fasciitis      PAST SURGICAL HISTORY:  Past Surgical History:  Procedure Laterality Date   biopsy of cyst in bilateral breasts     COLONOSCOPY     SKIN CANCER EXCISION     x 3 nose    UPPER GASTROINTESTINAL ENDOSCOPY     WISDOM TOOTH EXTRACTION      OB/GYN HISTORY:  OB History  Gravida Para Term Preterm AB Living  1 0   1 0  SAB IAB Ectopic Multiple Live Births          # Outcome Date GA Lbr Len/2nd Weight Sex Type Anes PTL Lv  1 AB             No LMP recorded. Patient is postmenopausal.  Age at menarche: 59  Age at menopause: 27 Hx of HRT: denies Hx of STDs: denies Last pap: 8 years ago History of abnormal pap smears: denies  SCREENING STUDIES:  Last mammogram: last year per her report  Last colonoscopy: 2023  MEDICATIONS: Outpatient  Encounter Medications as of 04/07/2023  Medication Sig   buPROPion (WELLBUTRIN XL) 150 MG 24 hr tablet Take 150 mg by mouth daily.   meloxicam (MOBIC) 15 MG tablet TAKE 1 TABLET BY MOUTH EVERY DAY   ondansetron (ZOFRAN-ODT) 4 MG disintegrating tablet Take 1 tablet (4 mg total) by mouth every 8 (eight) hours as needed for nausea or vomiting.   oxyCODONE-acetaminophen (PERCOCET/ROXICET) 5-325 MG tablet Take 1 tablet by mouth every 6 (six) hours as needed for severe pain (pain score 7-10) or moderate pain (pain score 4-6).   No facility-administered encounter medications on file as of 04/07/2023.    ALLERGIES:  Allergies  Allergen Reactions   Codeine     Stomach and colon spasms   Egg-Derived Products     Large amt eggs cause stomach spasms.     FAMILY HISTORY:  Family  History  Problem Relation Age of Onset   Colon cancer Father 23       died at 103   Leukemia Mother    Colon polyps Sister    Stomach cancer Maternal Aunt    Stomach cancer Paternal Aunt    Esophageal cancer Neg Hx    Rectal cancer Neg Hx      SOCIAL HISTORY:  Social Connections: Not on file    REVIEW OF SYSTEMS:  + pelvic pain Denies appetite changes, fevers, chills, fatigue, unexplained weight changes. Denies hearing loss, neck lumps or masses, mouth sores, ringing in ears or voice changes. Denies cough or wheezing.  Denies shortness of breath. Denies chest pain or palpitations. Denies leg swelling. Denies abdominal distention, pain, blood in stools, constipation, diarrhea, nausea, vomiting, or early satiety. Denies pain with intercourse, dysuria, frequency, hematuria or incontinence. Denies hot flashes, vaginal bleeding or vaginal discharge.   Denies joint pain, back pain or muscle pain/cramps. Denies itching, rash, or wounds. Denies dizziness, headaches, numbness or seizures. Denies swollen lymph nodes or glands, denies easy bruising or bleeding. Denies anxiety, depression, confusion, or decreased concentration.  Physical Exam:  Vital Signs for this encounter:  Blood pressure (!) 142/78, pulse 81, temperature (!) 97.4 F (36.3 C), temperature source Oral, resp. rate 18, height 5\' 4"  (1.626 m), weight 132 lb 9.6 oz (60.1 kg), SpO2 100%. Body mass index is 22.76 kg/m. General: Alert, oriented, no acute distress.  HEENT: Normocephalic, atraumatic. Sclera anicteric.  Chest: Clear to auscultation bilaterally. No wheezes, rhonchi, or rales. Cardiovascular: Regular rate and rhythm, no murmurs, rubs, or gallops.  Abdomen: Normoactive bowel sounds. Soft, nondistended, nontender to palpation.  Mass palpable in the lower right and mid abdomen, no hepatosplenomegaly appreciated. No palpable fluid wave.  Extremities: Grossly normal range of motion. Warm, well perfused. No edema  bilaterally.  Skin: No rashes, some hyperpigmented areas of her lower extremities.  Lymphatics: No cervical, supraclavicular, or inguinal adenopathy.  GU:  Normal external female genitalia. No lesions. No discharge or bleeding.             Bladder/urethra:  No lesions or masses, well supported bladder             Vagina: Moderately atrophic, no lesions noted.             Cervix: Normal appearing, no lesions.  Atrophic.  Pap and HPV collected.             Uterus: Small, mobile, no parametrial involvement or nodularity.             Adnexa: Mass appreciated in the mid pelvis and to  the right, somewhat mobile.  No nodularity.  Rectal: Deferred.  LABORATORY AND RADIOLOGIC DATA:  Outside medical records were reviewed to synthesize the above history, along with the history and physical obtained during the visit.   Lab Results  Component Value Date   WBC 7.3 04/04/2023   HGB 14.2 04/04/2023   HCT 43.6 04/04/2023   PLT 308 04/04/2023   GLUCOSE 131 (H) 04/04/2023   ALT 13 04/04/2023   AST 20 04/04/2023   NA 138 04/04/2023   K 3.8 04/04/2023   CL 103 04/04/2023   CREATININE 0.89 04/04/2023   BUN 12 04/04/2023   CO2 22 04/04/2023

## 2023-04-07 NOTE — Patient Instructions (Signed)
It was very to meet you today.  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  Based on its appearance as well as your symptoms, I recommend surgery for removal.  We also discussed some of the features on imaging that raise the concern for possible precancer or cancer of the ovary.  You had blood test during your visit to the emergency department that are tumor markers, which would suggest a low risk that this mass represents a cancer.  My suspicion is that it twisted on its blood supply which is what caused your sudden increased pain.  I am hopeful we may be able to schedule your surgery on January 2.  If not, we will be looking at January 8 or 15.  Somebody will be in touch from my office tomorrow.

## 2023-04-08 ENCOUNTER — Encounter: Payer: Self-pay | Admitting: Gynecologic Oncology

## 2023-04-08 ENCOUNTER — Telehealth: Payer: Self-pay | Admitting: *Deleted

## 2023-04-08 IMAGING — CT CT CARDIAC CORONARY ARTERY CALCIUM SCORE
3 series · 14 of 20 positions shown, 16 images · non-contrast
Comparison: None Available.

CLINICAL DATA: Pure hypercholesterolemia

EXAM:
CT CARDIAC CORONARY ARTERY CALCIUM SCORE
TECHNIQUE: Non-contrast imaging through the heart was performed using
prospective ECG gating. Image post processing was performed on an
independent workstation, allowing for quantitative analysis of the
heart and coronary arteries. Note that this exam targets the heart
and the chest was not imaged in its entirety.

[Series 2: calcium scoring 2.00 qr36 bestdiast 69% hrt calciu · axial · 0.36mm/px · z∈[+1632,+1716]mm · 4 of 70 slices shown]
[im 14/70  vessel]
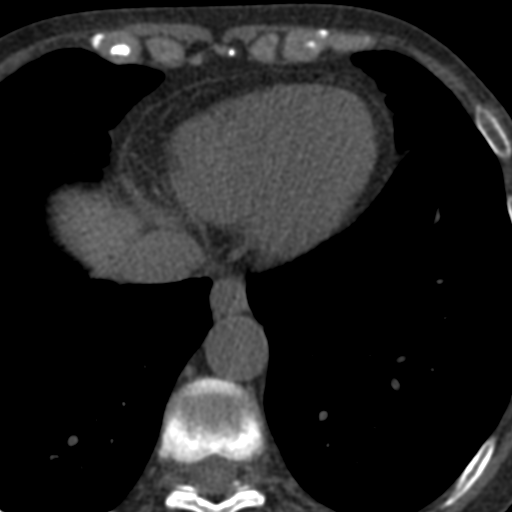
[im 28/70  vessel]
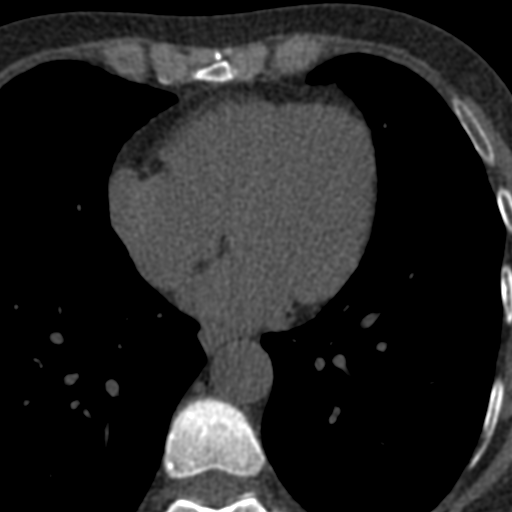
[im 42/70  vessel]
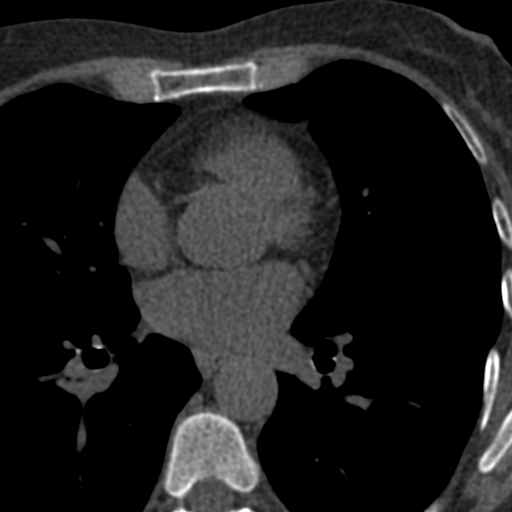
[im 56/70  vessel]
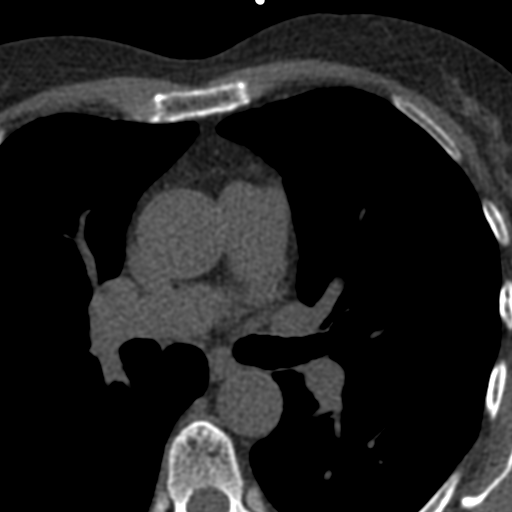

[Series 3: calcium scoring 2.00 br40 bestdiast 69% axial · axial · 0.48mm/px · z∈[+1628,+1720]mm · 5 of 70 slices shown, 7 images]
[im 12/70  vessel]
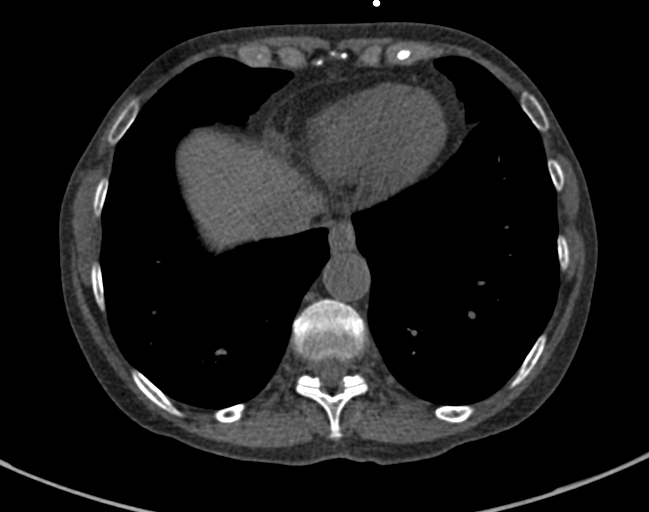
[im 12/70  lung]
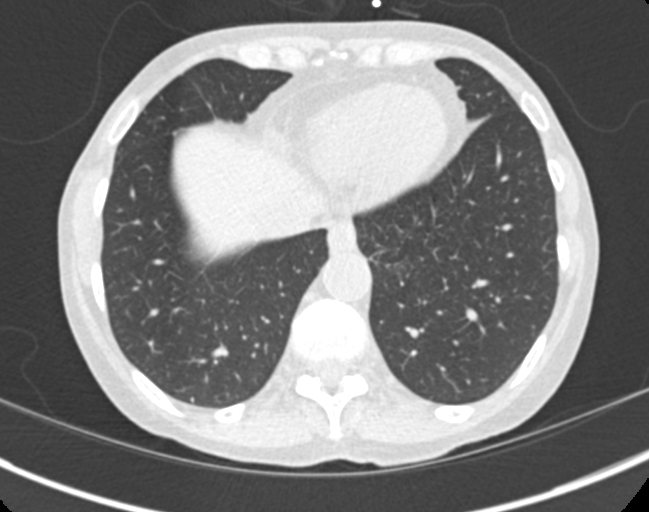
[im 24/70  vessel]
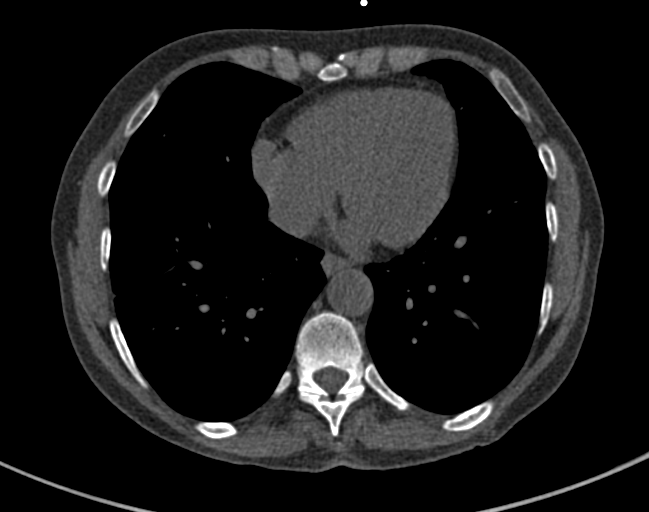
[im 35/70  vessel]
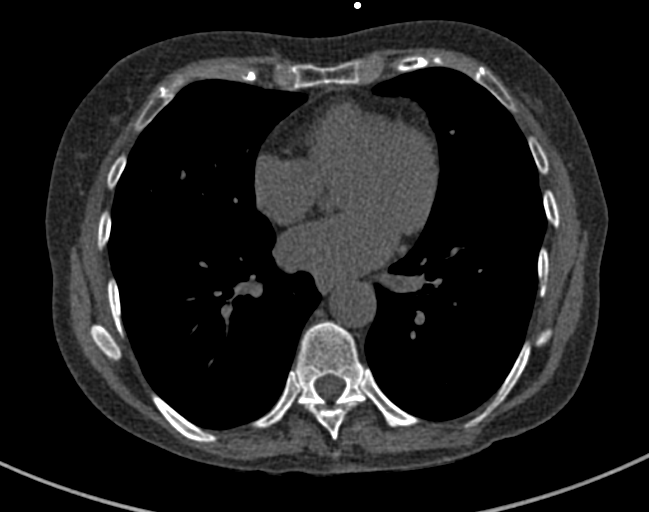
[im 47/70  vessel]
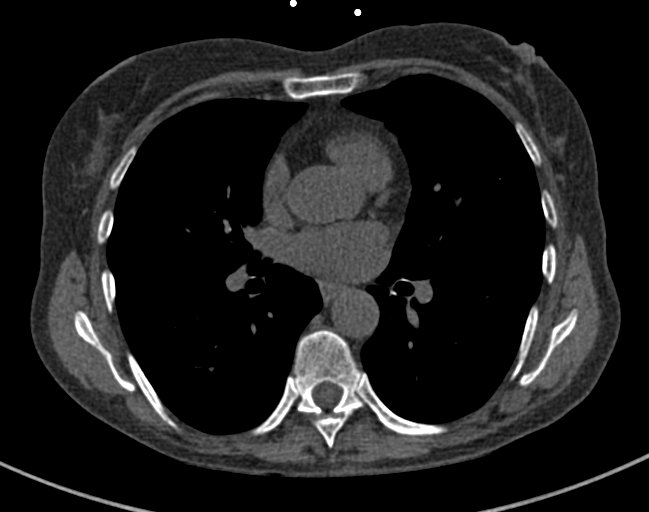
[im 58/70  vessel]
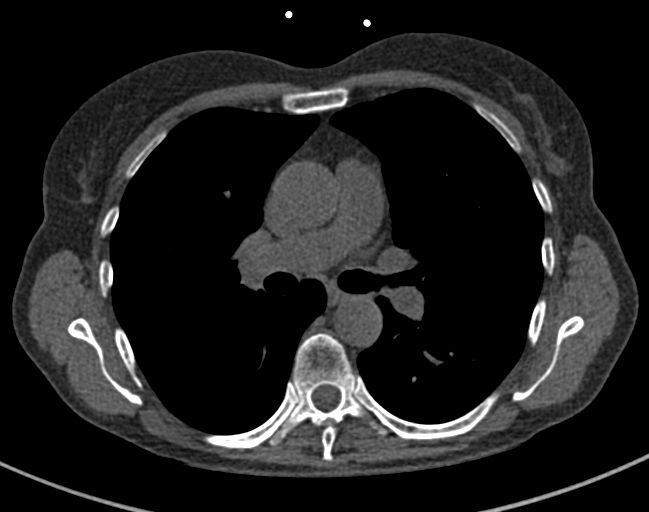
[im 58/70  lung]
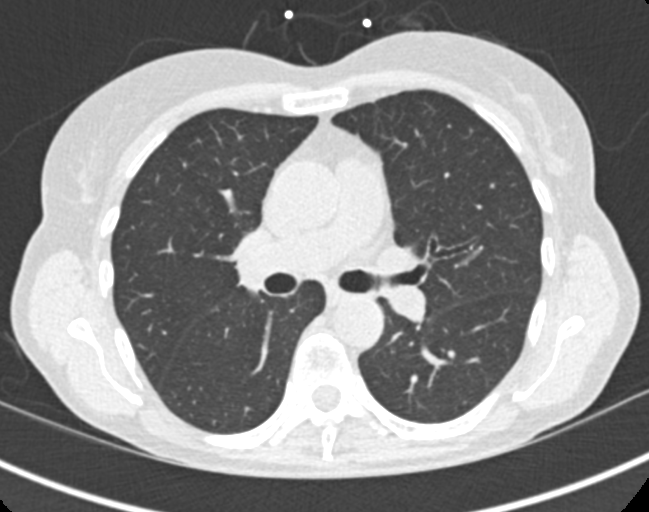

[Series 9: calcium scoring 2.00 br60 bestdiast 69% lungs · axial · 0.48mm/px · z∈[+1628,+1720]mm · 5 of 70 slices shown]
[im 12/70  vessel]
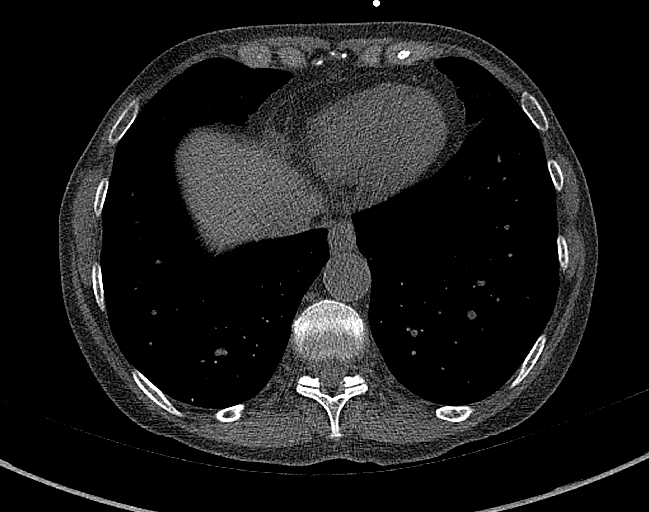
[im 24/70  vessel]
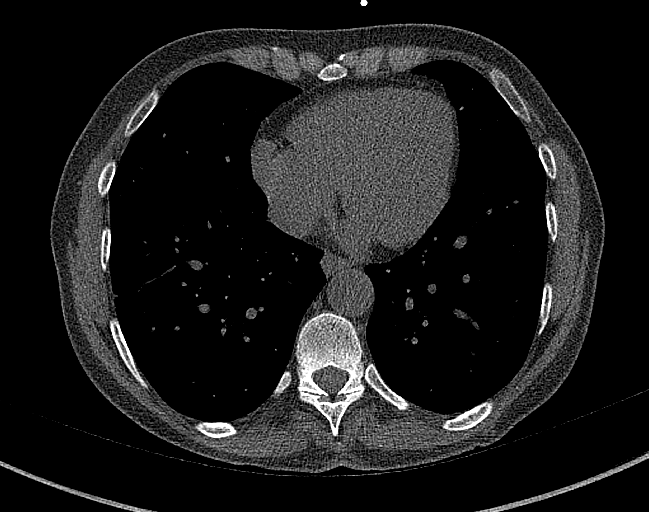
[im 35/70  vessel]
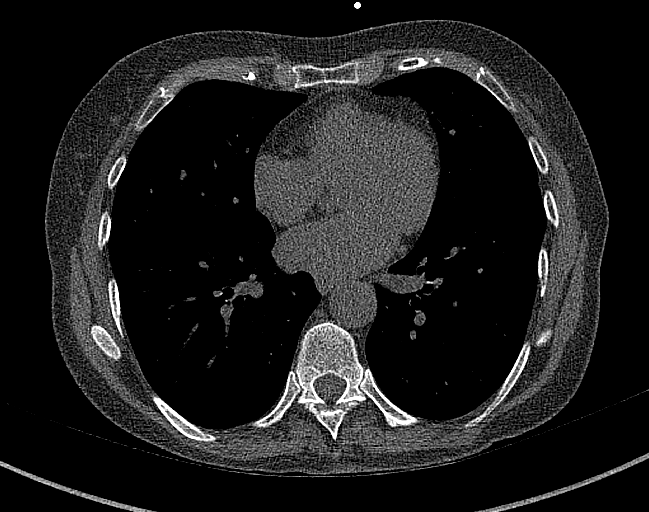
[im 47/70  vessel]
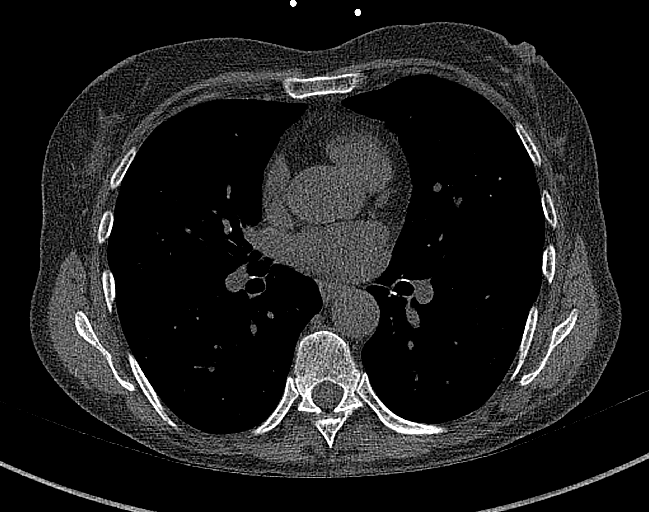
[im 58/70  vessel]
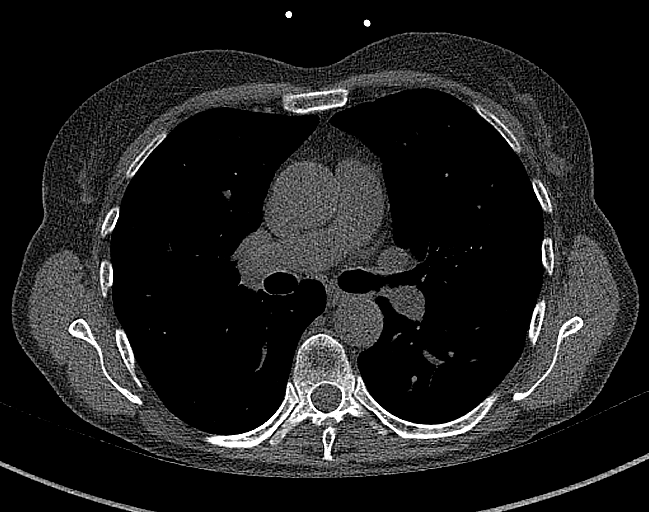

[14 of 20 positions shown; findings below may reference images not displayed]

FINDINGS: CORONARY CALCIUM SCORES:

Left Main: 0

LAD: 0

LCx: 0

RCA: 0

Total Agatston Score: 0

[HOSPITAL] percentile: 0

AORTA MEASUREMENTS:

Ascending Aorta: 31 mm

Descending Aorta: 22 mm

OTHER FINDINGS:

Heart is normal size. Aorta normal caliber. Calcifications in the
aortic root. No adenopathy. No confluent opacities or effusions. No
acute findings in the upper abdomen. Chest wall soft tissues are
unremarkable. No acute bony abnormality.
IMPRESSION: No visible coronary artery calcifications. Total coronary calcium
score of 0.

Scattered aortic root calcifications.

No acute extra cardiac abnormality.

## 2023-04-08 NOTE — Progress Notes (Signed)
COVID Vaccine Completed:  Date of COVID positive in last 90 days:  PCP - Guerry Bruin, MD Cardiologist -   Chest x-ray -  EKG -  Stress Test -  ECHO -  Cardiac Cath -  Pacemaker/ICD device last checked: Spinal Cord Stimulator: Cardiac CT 09-04-21 Epic  Bowel Prep -   Sleep Study -  CPAP -   Fasting Blood Sugar -  Checks Blood Sugar _____ times a day  Last dose of GLP1 agonist-  N/A GLP1 instructions:  Hold 7 days before surgery    Last dose of SGLT-2 inhibitors-  N/A SGLT-2 instructions:  Hold 3 days before surgery    Blood Thinner Instructions:  Time Aspirin Instructions: Last Dose:  Activity level:  Can go up a flight of stairs and perform activities of daily living without stopping and without symptoms of chest pain or shortness of breath.  Able to exercise without symptoms  Unable to go up a flight of stairs without symptoms of     Anesthesia review:   Patient denies shortness of breath, fever, cough and chest pain at PAT appointment  Patient verbalized understanding of instructions that were given to them at the PAT appointment. Patient was also instructed that they will need to review over the PAT instructions again at home before surgery.

## 2023-04-08 NOTE — Telephone Encounter (Signed)
Per Dr Pricilla Holm fax records and surgical optimization form to patient's PCP (Dr Wylene Simmer [phone])

## 2023-04-08 NOTE — Telephone Encounter (Signed)
-----   Message from Doylene Bode sent at 04/08/2023  8:20 AM EST ----- Please let this patient know her surgery has been scheduled for Apr 14, 2023. She will receive a phone call from preop to arrange for a preop appt. She has mobic on her med list. Please tell her to not use this at least 7 days before surgery, so stop now.  We can do her preop with our office on the same day she comes for preop or if she is on mychart regularly, I can sent the instructions through mychart and we can call to review them.  She has percocet on her med list which she was given on 12/23. Would see if she is using this. Will send in a refill for AFTER surgery only. She could also use tylenol. If she gets back on the mobic after surgery then do not take with ibuprofen and make sure she takes with food.

## 2023-04-08 NOTE — Patient Instructions (Signed)
SURGICAL WAITING ROOM VISITATION Patients having surgery or a procedure may have no more than 2 support people in the waiting area - these visitors may rotate.    Children under the age of 24 must have an adult with them who is not the patient.  If the patient needs to stay at the hospital during part of their recovery, the visitor guidelines for inpatient rooms apply. Pre-op nurse will coordinate an appropriate time for 1 support person to accompany patient in pre-op.  This support person may not rotate.    Please refer to the [facility] website for the visitor guidelines for Inpatients (after your surgery is over and you are in a regular room).       Your procedure is scheduled on: 04-14-23   Report to [facility] Main Entrance    Report to admitting at 8:45  AM   Call this number if you have problems the morning of surgery [phone]   Follow a light diet the day before surgery (avoid gas producing foods)   Do not eat food :After Midnight.   After Midnight you may have the following liquids until 7:50 AM DAY OF SURGERY  Water Non-Citrus Juices (without pulp, NO RED-Apple, White grape, White cranberry) Black Coffee (NO MILK/CREAM OR CREAMERS, sugar ok)  Clear Tea (NO MILK/CREAM OR CREAMERS, sugar ok) regular and decaf                             Plain Jell-O (NO RED)                                           Fruit ices (not with fruit pulp, NO RED)                                     Popsicles (NO RED)                                                               Sports drinks like Gatorade (NO RED)                      If you have questions, please contact your surgeon's office.   FOLLOW BOWEL PREP AND ANY ADDITIONAL PRE OP INSTRUCTIONS YOU RECEIVED FROM YOUR SURGEON'S OFFICE!!!     Oral Hygiene is also important to reduce your risk of infection.                                    Remember - BRUSH YOUR TEETH THE MORNING OF SURGERY WITH YOUR REGULAR  TOOTHPASTE   Do NOT smoke after Midnight   Take these medicines the morning of surgery with A SIP OF WATER:   Bupropion  If needed Tylenol, Oxycodone, Ondansetron  Stop all vitamins and herbal supplements 7 days before surgery  You may not have any metal on your body including hair pins, jewelry, and body piercing             Do not wear make-up, lotions, powders, perfumes or deodorant  Do not wear nail polish including gel and S&S, artificial/acrylic nails, or any other type of covering on natural nails including finger and toenails. If you have artificial nails, gel coating, etc. that needs to be removed by a nail salon please have this removed prior to surgery or surgery may need to be canceled/ delayed if the surgeon/ anesthesia feels like they are unable to be safely monitored.   Do not shave  48 hours prior to surgery.        Do not bring valuables to the hospital. [facility] IS NOT RESPONSIBLE   FOR VALUABLES.   Contacts, dentures or bridgework may not be worn into surgery.  DO NOT BRING YOUR HOME MEDICATIONS TO THE HOSPITAL. PHARMACY WILL DISPENSE MEDICATIONS LISTED ON YOUR MEDICATION LIST TO YOU DURING YOUR ADMISSION IN THE HOSPITAL!    Patients discharged on the day of surgery will not be allowed to drive home.  Someone NEEDS to stay with you for the first 24 hours after anesthesia.   Special Instructions: Bring a copy of your healthcare power of attorney and living will documents the day of surgery if you haven't scanned them before.              Please read over the following fact sheets you were given: IF YOU HAVE QUESTIONS ABOUT YOUR PRE-OP INSTRUCTIONS PLEASE CALL [phone] [place]  If you received a COVID test during your pre-op visit  it is requested that you wear a mask when out in public, stay away from anyone that may not be feeling well and notify your surgeon if you develop symptoms. If you test positive for Covid or have been in  contact with anyone that has tested positive in the last 10 days please notify you surgeon.  [place] - Preparing for Surgery Before surgery, you can play an important role.  Because skin is not sterile, your skin needs to be as free of germs as possible.  You can reduce the number of germs on your skin by washing with CHG (chlorahexidine gluconate) soap before surgery.  CHG is an antiseptic cleaner which kills germs and bonds with the skin to continue killing germs even after washing. Please DO NOT use if you have an allergy to CHG or antibacterial soaps.  If your skin becomes reddened/irritated stop using the CHG and inform your nurse when you arrive at Short Stay. Do not shave (including legs and underarms) for at least 48 hours prior to the first CHG shower.  You may shave your face/neck.  Please follow these instructions carefully:  1.  Shower with CHG Soap the night before surgery and the  morning of surgery.  2.  If you choose to wash your hair, wash your hair first as usual with your normal  shampoo.  3.  After you shampoo, rinse your hair and body thoroughly to remove the shampoo.                             4.  Use CHG as you would any other liquid soap.  You can apply chg directly to the skin and wash.  Gently with a scrungie or clean washcloth.  5.  Apply the CHG Soap to your  body ONLY FROM THE NECK DOWN.   Do   not use on face/ open                           Wound or open sores. Avoid contact with eyes, ears mouth and   genitals (private parts).                       Wash face,  Genitals (private parts) with your normal soap.             6.  Wash thoroughly, paying special attention to the area where your    surgery  will be performed.  7.  Thoroughly rinse your body with warm water from the neck down.  8.  DO NOT shower/wash with your normal soap after using and rinsing off the CHG Soap.                9.  Pat yourself dry with a clean towel.            10.  Wear clean pajamas.             11.  Place clean sheets on your bed the night of your first shower and do not  sleep with pets. Day of Surgery : Do not apply any lotions/deodorants the morning of surgery.  Please wear clean clothes to the hospital/surgery center.  FAILURE TO FOLLOW THESE INSTRUCTIONS MAY RESULT IN THE CANCELLATION OF YOUR SURGERY  PATIENT SIGNATURE_________________________________  NURSE SIGNATURE__________________________________  ________________________________________________________________________   WHAT IS A BLOOD TRANSFUSION? Blood Transfusion Information  A transfusion is the replacement of blood or some of its parts. Blood is made up of multiple cells which provide different functions. Red blood cells carry oxygen and are used for blood loss replacement. White blood cells fight against infection. Platelets control bleeding. Plasma helps clot blood. Other blood products are available for specialized needs, such as hemophilia or other clotting disorders. BEFORE THE TRANSFUSION  Who gives blood for transfusions?  Healthy volunteers who are fully evaluated to make sure their blood is safe. This is blood bank blood. Transfusion therapy is the safest it has ever been in the practice of medicine. Before blood is taken from a donor, a complete history is taken to make sure that person has no history of diseases nor engages in risky social behavior (examples are intravenous drug use or sexual activity with multiple partners). The donor's travel history is screened to minimize risk of transmitting infections, such as malaria. The donated blood is tested for signs of infectious diseases, such as HIV and hepatitis. The blood is then tested to be sure it is compatible with you in order to minimize the chance of a transfusion reaction. If you or a relative donates blood, this is often done in anticipation of surgery and is not appropriate for emergency situations. It takes many days to process the  donated blood. RISKS AND COMPLICATIONS Although transfusion therapy is very safe and saves many lives, the main dangers of transfusion include:  Getting an infectious disease. Developing a transfusion reaction. This is an allergic reaction to something in the blood you were given. Every precaution is taken to prevent this. The decision to have a blood transfusion has been considered carefully by your caregiver before blood is given. Blood is not given unless the benefits outweigh the risks. AFTER THE TRANSFUSION Right after receiving a blood transfusion, you will usually feel  much better and more energetic. This is especially true if your red blood cells have gotten low (anemic). The transfusion raises the level of the red blood cells which carry oxygen, and this usually causes an energy increase. The nurse administering the transfusion will monitor you carefully for complications. HOME CARE INSTRUCTIONS  No special instructions are needed after a transfusion. You may find your energy is better. Speak with your caregiver about any limitations on activity for underlying diseases you may have. SEEK MEDICAL CARE IF:  Your condition is not improving after your transfusion. You develop redness or irritation at the intravenous (IV) site. SEEK IMMEDIATE MEDICAL CARE IF:  Any of the following symptoms occur over the next 12 hours: Shaking chills. You have a temperature by mouth above 102 F (38.9 C), not controlled by medicine. Chest, back, or muscle pain. People around you feel you are not acting correctly or are confused. Shortness of breath or difficulty breathing. Dizziness and fainting. You get a rash or develop hives. You have a decrease in urine output. Your urine turns a dark color or changes to pink, red, or brown. Any of the following symptoms occur over the next 10 days: You have a temperature by mouth above 102 F (38.9 C), not controlled by medicine. Shortness of breath. Weakness  after normal activity. The white part of the eye turns yellow (jaundice). You have a decrease in the amount of urine or are urinating less often. Your urine turns a dark color or changes to pink, red, or brown. Document Released: 03/26/2000 Document Revised: 06/21/2011 Document Reviewed: 11/13/2007 [facility] Patient Information 2014 [place], [place].  _______________________________________________________________________

## 2023-04-08 NOTE — Telephone Encounter (Signed)
Sarah Chase called office. She states she spoke to [name] this morning and was told to call back with her hospital pre-admission appointment. She states pre-admit appointment is scheduled for this Monday 12/30 at 8:00.   Pt aware I will relay message to [facility] and she would be able to see pre-op instructions in her Mychart.

## 2023-04-08 NOTE — Telephone Encounter (Signed)
Pt is scheduled for a phone pre-op with Clydie Braun, nurse navigator, on Monday 12/30 at 1:00pm.

## 2023-04-08 NOTE — Telephone Encounter (Signed)
Spoke with Ms. Levada Schilling and relayed message from Warner Mccreedy, NP that patient's surgery is scheduled for Apr 14, 2023. Pt will receive a call from pre-op to arrange for a pre-op appt.  Pt advised to stop taking her Mobic today and do not use 7 days prior to surgery. Pt states she hasn't taken her Mobic since 12/23. Pt also states she hasn't taken her percocet as well and is only using extra strength tylenol for pain. Pt advised if she gets back on Mobic after surgery then do not take with ibuprofen and make sure this is taken with food.   Instructed patient that we can arrange for her pre-op instructions on the same day as her pre-op admission testing appt. In our office or we can send instructions through MyChart and can be reviewed together.   Ms. Reen verbalized understanding and thanked the office for calling.

## 2023-04-11 ENCOUNTER — Other Ambulatory Visit: Payer: Self-pay

## 2023-04-11 ENCOUNTER — Encounter (HOSPITAL_COMMUNITY)
Admission: RE | Admit: 2023-04-11 | Discharge: 2023-04-11 | Disposition: A | Discharge status: Home / Self Care | Payer: 59 | Source: Ambulatory Visit | Attending: Gynecologic Oncology | Admitting: Gynecologic Oncology

## 2023-04-11 ENCOUNTER — Encounter (HOSPITAL_COMMUNITY): Payer: Self-pay

## 2023-04-11 ENCOUNTER — Inpatient Hospital Stay (HOSPITAL_BASED_OUTPATIENT_CLINIC_OR_DEPARTMENT_OTHER): Payer: 59

## 2023-04-11 DIAGNOSIS — Z124 Encounter for screening for malignant neoplasm of cervix: Secondary | ICD-10-CM | POA: Diagnosis not present

## 2023-04-11 DIAGNOSIS — Z01812 Encounter for preprocedural laboratory examination: Secondary | ICD-10-CM | POA: Insufficient documentation

## 2023-04-11 DIAGNOSIS — N9489 Other specified conditions associated with female genital organs and menstrual cycle: Secondary | ICD-10-CM | POA: Insufficient documentation

## 2023-04-11 DIAGNOSIS — D398 Neoplasm of uncertain behavior of other specified female genital organs: Secondary | ICD-10-CM | POA: Diagnosis present

## 2023-04-11 DIAGNOSIS — Z806 Family history of leukemia: Secondary | ICD-10-CM | POA: Diagnosis not present

## 2023-04-11 DIAGNOSIS — F32A Depression, unspecified: Secondary | ICD-10-CM | POA: Diagnosis not present

## 2023-04-11 DIAGNOSIS — Z860100 Personal history of colon polyps, unspecified: Secondary | ICD-10-CM | POA: Diagnosis not present

## 2023-04-11 DIAGNOSIS — Z79899 Other long term (current) drug therapy: Secondary | ICD-10-CM | POA: Diagnosis not present

## 2023-04-11 DIAGNOSIS — Z85828 Personal history of other malignant neoplasm of skin: Secondary | ICD-10-CM | POA: Diagnosis not present

## 2023-04-11 DIAGNOSIS — R102 Pelvic and perineal pain: Secondary | ICD-10-CM | POA: Diagnosis not present

## 2023-04-11 DIAGNOSIS — Z800 Family history of malignant neoplasm of digestive organs: Secondary | ICD-10-CM | POA: Diagnosis not present

## 2023-04-11 DIAGNOSIS — Z791 Long term (current) use of non-steroidal anti-inflammatories (NSAID): Secondary | ICD-10-CM | POA: Diagnosis not present

## 2023-04-11 LAB — CYTOLOGY - PAP
Comment: NEGATIVE
Diagnosis: NEGATIVE
High risk HPV: NEGATIVE

## 2023-04-11 NOTE — Progress Notes (Signed)
Called Sarah Chase and reviewed the pre op instructions for her 04/14/2023 surgery.  She had her preoperative appointment at [facility] this morning.  She did not have any questions regarding the instructions.  Advised her to call if she does have any questions or needs before surgery.

## 2023-04-12 ENCOUNTER — Telehealth: Payer: Self-pay

## 2023-04-12 NOTE — Telephone Encounter (Signed)
Telephone call to check on pre-operative status.  Patient compliant with pre-operative instructions.  Reinforced nothing to eat after midnight. Clear liquids until 0745. Patient to arrive at 0845.  No questions or concerns voiced.  Instructed to call for any needs.  °

## 2023-04-13 NOTE — Anesthesia Preprocedure Evaluation (Addendum)
 Anesthesia Evaluation  Patient identified by MRN, date of birth, ID band Patient awake    Reviewed: Allergy & Precautions, NPO status , Patient's Chart, lab work & pertinent test results  Airway Mallampati: II       Dental  (+) Teeth Intact, Missing, Poor Dentition, Chipped, Dental Advisory Given,    Pulmonary former smoker   Pulmonary exam normal breath sounds clear to auscultation       Cardiovascular negative cardio ROS Normal cardiovascular exam Rhythm:Regular Rate:Normal     Neuro/Psych  PSYCHIATRIC DISORDERS  Depression    negative neurological ROS     GI/Hepatic negative GI ROS, Neg liver ROS,,,  Endo/Other  negative endocrine ROS    Renal/GU negative Renal ROS  negative genitourinary   Musculoskeletal negative musculoskeletal ROS (+)    Abdominal   Peds  Hematology negative hematology ROS (+)   Anesthesia Other Findings   Reproductive/Obstetrics Adnexal mass                             Anesthesia Physical Anesthesia Plan  ASA: 2  Anesthesia Plan: General   Post-op Pain Management: Dilaudid  IV, Tylenol  PO (pre-op)* and Precedex   Induction: Intravenous  PONV Risk Score and Plan: 4 or greater and Treatment may vary due to age or medical condition, Midazolam , Ondansetron  and Dexamethasone   Airway Management Planned: Oral ETT  Additional Equipment: None  Intra-op Plan:   Post-operative Plan: Extubation in OR  Informed Consent: I have reviewed the patients History and Physical, chart, labs and discussed the procedure including the risks, benefits and alternatives for the proposed anesthesia with the patient or authorized representative who has indicated his/her understanding and acceptance.     Dental advisory given  Plan Discussed with: Anesthesiologist and CRNA  Anesthesia Plan Comments:        Anesthesia Quick Evaluation

## 2023-04-14 ENCOUNTER — Other Ambulatory Visit: Payer: Self-pay

## 2023-04-14 ENCOUNTER — Ambulatory Visit (HOSPITAL_BASED_OUTPATIENT_CLINIC_OR_DEPARTMENT_OTHER): Payer: 59 | Admitting: Anesthesiology

## 2023-04-14 ENCOUNTER — Ambulatory Visit (HOSPITAL_COMMUNITY)
Admission: RE | Admit: 2023-04-14 | Discharge: 2023-04-14 | Disposition: A | Discharge status: Home / Self Care | Payer: 59 | Source: Ambulatory Visit | Attending: Gynecologic Oncology | Admitting: Gynecologic Oncology

## 2023-04-14 ENCOUNTER — Ambulatory Visit (HOSPITAL_COMMUNITY): Payer: 59 | Admitting: Anesthesiology

## 2023-04-14 ENCOUNTER — Encounter (HOSPITAL_COMMUNITY)
Admission: RE | Disposition: A | Discharge status: Home / Self Care | Payer: Self-pay | Source: Ambulatory Visit | Attending: Gynecologic Oncology

## 2023-04-14 ENCOUNTER — Encounter (HOSPITAL_COMMUNITY): Payer: Self-pay | Admitting: Gynecologic Oncology

## 2023-04-14 DIAGNOSIS — D270 Benign neoplasm of right ovary: Secondary | ICD-10-CM | POA: Diagnosis not present

## 2023-04-14 DIAGNOSIS — N898 Other specified noninflammatory disorders of vagina: Secondary | ICD-10-CM | POA: Diagnosis not present

## 2023-04-14 DIAGNOSIS — N83201 Unspecified ovarian cyst, right side: Secondary | ICD-10-CM

## 2023-04-14 DIAGNOSIS — Z87891 Personal history of nicotine dependence: Secondary | ICD-10-CM | POA: Insufficient documentation

## 2023-04-14 DIAGNOSIS — D271 Benign neoplasm of left ovary: Secondary | ICD-10-CM | POA: Diagnosis present

## 2023-04-14 DIAGNOSIS — F32A Depression, unspecified: Secondary | ICD-10-CM | POA: Diagnosis not present

## 2023-04-14 DIAGNOSIS — N9489 Other specified conditions associated with female genital organs and menstrual cycle: Secondary | ICD-10-CM

## 2023-04-14 HISTORY — PX: ROBOTIC ASSISTED SALPINGO OOPHERECTOMY: SHX6082

## 2023-04-14 LAB — CBC
HCT: 43 % (ref 36.0–46.0)
Hemoglobin: 14.2 g/dL (ref 12.0–15.0)
MCH: 32.8 pg (ref 26.0–34.0)
MCHC: 33 g/dL (ref 30.0–36.0)
MCV: 99.3 fL (ref 80.0–100.0)
Platelets: 292 10*3/uL (ref 150–400)
RBC: 4.33 MIL/uL (ref 3.87–5.11)
RDW: 13 % (ref 11.5–15.5)
WBC: 14.4 10*3/uL — ABNORMAL HIGH (ref 4.0–10.5)
nRBC: 0 % (ref 0.0–0.2)

## 2023-04-14 LAB — COMPREHENSIVE METABOLIC PANEL
ALT: 13 U/L (ref 0–44)
AST: 19 U/L (ref 15–41)
Albumin: 4 g/dL (ref 3.5–5.0)
Alkaline Phosphatase: 37 U/L — ABNORMAL LOW (ref 38–126)
Anion gap: 11 (ref 5–15)
BUN: 9 mg/dL (ref 8–23)
CO2: 19 mmol/L — ABNORMAL LOW (ref 22–32)
Calcium: 9.4 mg/dL (ref 8.9–10.3)
Chloride: 107 mmol/L (ref 98–111)
Creatinine, Ser: 0.6 mg/dL (ref 0.44–1.00)
GFR, Estimated: >60 mL/min (ref >60)
Glucose, Bld: 94 mg/dL (ref 70–99)
Potassium: 4 mmol/L (ref 3.5–5.1)
Sodium: 137 mmol/L (ref 135–145)
Total Bilirubin: 0.5 mg/dL (ref 0.0–1.2)
Total Protein: 7.2 g/dL (ref 6.5–8.1)

## 2023-04-14 LAB — TYPE AND SCREEN
ABO/RH(D): O POS
Antibody Screen: NEGATIVE

## 2023-04-14 LAB — ABO/RH: ABO/RH(D): O POS

## 2023-04-14 SURGERY — SALPINGO-OOPHORECTOMY, ROBOT-ASSISTED
Anesthesia: General | Laterality: Bilateral

## 2023-04-14 MED ORDER — ONDANSETRON HCL 4 MG/2ML IJ SOLN
INTRAMUSCULAR | Status: DC | PRN
  Administered 2023-04-14: 4 mg via INTRAVENOUS

## 2023-04-14 MED ORDER — KETAMINE HCL 10 MG/ML IJ SOLN
INTRAMUSCULAR | Status: DC | PRN
  Administered 2023-04-14: 20 mg via INTRAVENOUS

## 2023-04-14 MED ORDER — ORAL CARE MOUTH RINSE: 15.0000 mL | Freq: Once | OROMUCOSAL | Status: AC

## 2023-04-14 MED ORDER — DEXAMETHASONE SODIUM PHOSPHATE 4 MG/ML IJ SOLN
4.0000 mg | INTRAMUSCULAR | Status: AC
  Administered 2023-04-14: 5 mg via INTRAVENOUS

## 2023-04-14 MED ORDER — MIDAZOLAM HCL 5 MG/5ML IJ SOLN
INTRAMUSCULAR | Status: DC | PRN
  Administered 2023-04-14: 2 mg via INTRAVENOUS

## 2023-04-14 MED ORDER — BUPIVACAINE HCL 0.25 % IJ SOLN
INTRAMUSCULAR | Status: AC
  Filled 2023-04-14: qty 1

## 2023-04-14 MED ORDER — BUPIVACAINE HCL 0.25 % IJ SOLN
INTRAMUSCULAR | Status: DC | PRN
  Administered 2023-04-14: 30 mL

## 2023-04-14 MED ORDER — OXYCODONE HCL 5 MG/5ML PO SOLN: 5.0000 mg | Freq: Once | ORAL | Status: AC | PRN

## 2023-04-14 MED ORDER — CHLORHEXIDINE GLUCONATE 0.12 % MT SOLN
15.0000 mL | Freq: Once | OROMUCOSAL | Status: AC
Start: 2023-04-14 — End: 2023-04-14
  Administered 2023-04-14: 15 mL via OROMUCOSAL

## 2023-04-14 MED ORDER — LACTATED RINGERS IV SOLN: INTRAVENOUS | Status: DC | PRN

## 2023-04-14 MED ORDER — HYDROMORPHONE HCL 1 MG/ML IJ SOLN
INTRAMUSCULAR | Status: AC
  Filled 2023-04-14: qty 1

## 2023-04-14 MED ORDER — FENTANYL CITRATE (PF) 250 MCG/5ML IJ SOLN
INTRAMUSCULAR | Status: AC
  Filled 2023-04-14: qty 5

## 2023-04-14 MED ORDER — OXYCODONE HCL 5 MG PO TABS
5.0000 mg | ORAL_TABLET | Freq: Once | ORAL | Status: AC | PRN
  Administered 2023-04-14: 5 mg via ORAL

## 2023-04-14 MED ORDER — DEXAMETHASONE SODIUM PHOSPHATE 10 MG/ML IJ SOLN
INTRAMUSCULAR | Status: AC
  Filled 2023-04-14: qty 1

## 2023-04-14 MED ORDER — DROPERIDOL 2.5 MG/ML IJ SOLN
0.6250 mg | Freq: Once | INTRAMUSCULAR | Status: DC | PRN
Start: 2023-04-14 — End: 2023-04-14

## 2023-04-14 MED ORDER — ONDANSETRON HCL 4 MG/2ML IJ SOLN: 4.0000 mg | Freq: Once | INTRAMUSCULAR | Status: DC | PRN

## 2023-04-14 MED ORDER — ONDANSETRON HCL 4 MG/2ML IJ SOLN
INTRAMUSCULAR | Status: AC
  Filled 2023-04-14: qty 2

## 2023-04-14 MED ORDER — SUGAMMADEX SODIUM 200 MG/2ML IV SOLN
INTRAVENOUS | Status: DC | PRN
  Administered 2023-04-14: 200 mg via INTRAVENOUS

## 2023-04-14 MED ORDER — KETAMINE HCL 50 MG/5ML IJ SOSY
PREFILLED_SYRINGE | INTRAMUSCULAR | Status: AC
  Filled 2023-04-14: qty 5

## 2023-04-14 MED ORDER — LIDOCAINE HCL (CARDIAC) PF 100 MG/5ML IV SOSY
PREFILLED_SYRINGE | INTRAVENOUS | Status: DC | PRN
  Administered 2023-04-14: 80 mg via INTRAVENOUS

## 2023-04-14 MED ORDER — PROPOFOL 10 MG/ML IV BOLUS
INTRAVENOUS | Status: AC
  Filled 2023-04-14: qty 20

## 2023-04-14 MED ORDER — ACETAMINOPHEN 500 MG PO TABS
1000.0000 mg | ORAL_TABLET | ORAL | Status: AC
  Administered 2023-04-14: 1000 mg via ORAL
  Filled 2023-04-14: qty 2

## 2023-04-14 MED ORDER — PROPOFOL 10 MG/ML IV BOLUS
INTRAVENOUS | Status: DC | PRN
  Administered 2023-04-14: 180 mg via INTRAVENOUS

## 2023-04-14 MED ORDER — ROCURONIUM BROMIDE 100 MG/10ML IV SOLN
INTRAVENOUS | Status: DC | PRN
  Administered 2023-04-14: 70 mg via INTRAVENOUS

## 2023-04-14 MED ORDER — PHENYLEPHRINE 80 MCG/ML (10ML) SYRINGE FOR IV PUSH (FOR BLOOD PRESSURE SUPPORT)
PREFILLED_SYRINGE | INTRAVENOUS | Status: AC
  Filled 2023-04-14: qty 10

## 2023-04-14 MED ORDER — PHENYLEPHRINE HCL (PRESSORS) 10 MG/ML IV SOLN
INTRAVENOUS | Status: AC
  Filled 2023-04-14: qty 1

## 2023-04-14 MED ORDER — FENTANYL CITRATE (PF) 100 MCG/2ML IJ SOLN
INTRAMUSCULAR | Status: DC | PRN
  Administered 2023-04-14: 50 ug via INTRAVENOUS
  Administered 2023-04-14: 150 ug via INTRAVENOUS

## 2023-04-14 MED ORDER — SENNOSIDES-DOCUSATE SODIUM 8.6-50 MG PO TABS: 2.0000 | ORAL_TABLET | Freq: Every day | ORAL | 0 refills | Status: DC

## 2023-04-14 MED ORDER — MIDAZOLAM HCL 2 MG/2ML IJ SOLN
INTRAMUSCULAR | Status: AC
  Filled 2023-04-14: qty 2

## 2023-04-14 MED ORDER — LACTATED RINGERS IR SOLN
Status: DC | PRN
  Administered 2023-04-14: 1000 mL

## 2023-04-14 MED ORDER — HEPARIN SODIUM (PORCINE) 5000 UNIT/ML IJ SOLN
5000.0000 [IU] | INTRAMUSCULAR | Status: AC
  Administered 2023-04-14: 5000 [IU] via SUBCUTANEOUS
  Filled 2023-04-14: qty 1

## 2023-04-14 MED ORDER — PHENYLEPHRINE HCL (PRESSORS) 10 MG/ML IV SOLN
INTRAVENOUS | Status: DC | PRN
  Administered 2023-04-14: 80 ug via INTRAVENOUS

## 2023-04-14 MED ORDER — OXYCODONE HCL 5 MG PO TABS
ORAL_TABLET | ORAL | Status: AC
  Filled 2023-04-14: qty 1

## 2023-04-14 MED ORDER — PROPOFOL 10 MG/ML IV BOLUS
INTRAVENOUS | Status: AC
Start: 2023-04-14 — End: ?
  Filled 2023-04-14: qty 20

## 2023-04-14 MED ORDER — HYDROMORPHONE HCL 1 MG/ML IJ SOLN
0.2500 mg | INTRAMUSCULAR | Status: DC | PRN
  Administered 2023-04-14: 0.5 mg via INTRAVENOUS

## 2023-04-14 SURGICAL SUPPLY — 71 items
APPLICATOR SURGIFLO ENDO (HEMOSTASIS) IMPLANT
BAG COUNTER SPONGE SURGICOUNT (BAG) IMPLANT
BAG LAPAROSCOPIC 12 15 PORT 16 (BASKET) IMPLANT
BAG RETRIEVAL 12/15 (BASKET)
BLADE SURG SZ10 CARB STEEL (BLADE) IMPLANT
COVER BACK TABLE 60X90IN (DRAPES) ×1 IMPLANT
COVER TIP SHEARS 8 DVNC (MISCELLANEOUS) ×1 IMPLANT
DERMABOND ADVANCED .7 DNX12 (GAUZE/BANDAGES/DRESSINGS) ×1 IMPLANT
DRAPE ARM DVNC X/XI (DISPOSABLE) ×4 IMPLANT
DRAPE COLUMN DVNC XI (DISPOSABLE) ×1 IMPLANT
DRAPE SHEET LG 3/4 BI-LAMINATE (DRAPES) ×1 IMPLANT
DRAPE SURG IRRIG POUCH 19X23 (DRAPES) ×1 IMPLANT
DRIVER NDL MEGA SUTCUT DVNCXI (INSTRUMENTS) ×1 IMPLANT
DRIVER NDLE MEGA SUTCUT DVNCXI (INSTRUMENTS) ×1
DRSG OPSITE POSTOP 4X6 (GAUZE/BANDAGES/DRESSINGS) IMPLANT
DRSG OPSITE POSTOP 4X8 (GAUZE/BANDAGES/DRESSINGS) IMPLANT
ELECT PENCIL ROCKER SW 15FT (MISCELLANEOUS) IMPLANT
ELECT REM PT RETURN 15FT ADLT (MISCELLANEOUS) ×1 IMPLANT
FORCEPS BPLR FENES DVNC XI (FORCEP) ×1 IMPLANT
FORCEPS PROGRASP DVNC XI (FORCEP) ×1 IMPLANT
GAUZE 4X4 16PLY ~~LOC~~+RFID DBL (SPONGE) ×2 IMPLANT
GLOVE BIO SURGEON STRL SZ 6 (GLOVE) ×4 IMPLANT
GLOVE BIO SURGEON STRL SZ 6.5 (GLOVE) ×1 IMPLANT
GOWN STRL REUS W/ TWL LRG LVL3 (GOWN DISPOSABLE) ×4 IMPLANT
GRASPER SUT TROCAR 14GX15 (MISCELLANEOUS) IMPLANT
HOLDER FOLEY CATH W/STRAP (MISCELLANEOUS) IMPLANT
IRRIG SUCT STRYKERFLOW 2 WTIP (MISCELLANEOUS) ×1
IRRIGATION SUCT STRKRFLW 2 WTP (MISCELLANEOUS) ×1 IMPLANT
KIT PROCEDURE DVNC SI (MISCELLANEOUS) IMPLANT
KIT TURNOVER KIT A (KITS) IMPLANT
LIGASURE IMPACT 36 18CM CVD LR (INSTRUMENTS) IMPLANT
MANIPULATOR ADVINCU DEL 3.0 PL (MISCELLANEOUS) IMPLANT
MANIPULATOR ADVINCU DEL 3.5 PL (MISCELLANEOUS) IMPLANT
MANIPULATOR UTERINE 4.5 ZUMI (MISCELLANEOUS) IMPLANT
NDL HYPO 21X1.5 SAFETY (NEEDLE) ×1 IMPLANT
NDL SPNL 18GX3.5 QUINCKE PK (NEEDLE) IMPLANT
NEEDLE HYPO 21X1.5 SAFETY (NEEDLE) ×1
NEEDLE SPNL 18GX3.5 QUINCKE PK (NEEDLE)
OBTURATOR OPTICAL STND 8 DVNC (TROCAR) ×1
OBTURATOR OPTICALSTD 8 DVNC (TROCAR) ×1 IMPLANT
PACK ROBOT GYN CUSTOM WL (TRAY / TRAY PROCEDURE) ×1 IMPLANT
PAD POSITIONING PINK XL (MISCELLANEOUS) ×1 IMPLANT
PORT ACCESS TROCAR AIRSEAL 12 (TROCAR) IMPLANT
SCISSORS MNPLR CVD DVNC XI (INSTRUMENTS) ×1 IMPLANT
SCRUB CHG 4% DYNA-HEX 4OZ (MISCELLANEOUS) IMPLANT
SEAL UNIV 5-12 XI (MISCELLANEOUS) ×4 IMPLANT
SET TRI-LUMEN FLTR TB AIRSEAL (TUBING) ×1 IMPLANT
SPIKE FLUID TRANSFER (MISCELLANEOUS) ×1 IMPLANT
SPONGE T-LAP 18X18 ~~LOC~~+RFID (SPONGE) IMPLANT
SURGIFLO W/THROMBIN 8M KIT (HEMOSTASIS) IMPLANT
SUT MNCRL AB 4-0 PS2 18 (SUTURE) IMPLANT
SUT PDS AB 1 TP1 96 (SUTURE) IMPLANT
SUT V-LOC 180 0-0 GS22 (SUTURE) IMPLANT
SUT VIC AB 0 CT1 27XBRD ANTBC (SUTURE) IMPLANT
SUT VIC AB 2-0 CT1 TAPERPNT 27 (SUTURE) IMPLANT
SUT VIC AB 4-0 PS2 18 (SUTURE) ×2 IMPLANT
SUT VICRYL 0 27 CT2 27 ABS (SUTURE) ×1 IMPLANT
SUT VLOC 180 0 9IN GS21 (SUTURE) IMPLANT
SYR 10ML LL (SYRINGE) IMPLANT
SYS BAG RETRIEVAL 10MM (BASKET)
SYS RETRIEVAL 5MM INZII UNIV (BASKET) ×1
SYS WOUND ALEXIS 18CM MED (MISCELLANEOUS)
SYSTEM BAG RETRIEVAL 10MM (BASKET) IMPLANT
SYSTEM RETRIEVL 5MM INZII UNIV (BASKET) IMPLANT
SYSTEM WOUND ALEXIS 18CM MED (MISCELLANEOUS) IMPLANT
TRAP SPECIMEN MUCUS 40CC (MISCELLANEOUS) IMPLANT
TRAY FOLEY MTR SLVR 16FR STAT (SET/KITS/TRAYS/PACK) ×1 IMPLANT
TROCAR PORT AIRSEAL 5X120 (TROCAR) IMPLANT
UNDERPAD 30X36 HEAVY ABSORB (UNDERPADS AND DIAPERS) ×2 IMPLANT
WATER STERILE IRR 1000ML POUR (IV SOLUTION) ×1 IMPLANT
YANKAUER SUCT BULB TIP 10FT TU (MISCELLANEOUS) IMPLANT

## 2023-04-14 NOTE — Discharge Instructions (Addendum)
 AFTER SURGERY INSTRUCTIONS   Return to work: 4-6 weeks if applicable   Activity: 1. Be up and out of the bed during the day.  Take a nap if needed.  You may walk up steps but be careful and use the hand rail.  Stair climbing will tire you more than you think, you may need to stop part way and rest.    2. No lifting or straining for 6 weeks over 10 pounds. No pushing, pulling, straining for 6 weeks.   3. No driving for 4-89 days when the following criteria have been met: Do not drive if you are taking narcotic pain medicine and make sure that your reaction time has returned.    4. You can shower as soon as the next day after surgery. Shower daily.  Use your regular soap and water (not directly on the incision) and pat your incision(s) dry afterwards; don't rub.  No tub baths or submerging your body in water until cleared by your surgeon. If you have the soap that was given to you by pre-surgical testing that was used before surgery, you do not need to use it afterwards because this can irritate your incisions.    5. No sexual activity and nothing in the vagina for 6 weeks, 10-12 WEEKS IF YOU HAVE A HYSTERECTOMY (removal of uterus and cervix).   6. You may experience a small amount of clear drainage from your incisions, which is normal.  If the drainage persists, increases, or changes color please call the office.   7. Do not use creams, lotions, or ointments such as neosporin on your incisions after surgery until advised by your surgeon because they can cause removal of the dermabond glue on your incisions.     8. You may experience vaginal spotting after surgery or when the stitches at the top of the vagina begin to dissolve (if you have a hysterectomy).  The spotting is normal but if you experience heavy bleeding, call our office.   9. Take Tylenol  or ibuprofen (OR MOBIC  (MELOXICAM ), DO NOT TAKE TOGETHER) first for pain if you are able to take these medications and only use narcotic pain  medication for severe pain not relieved by the Tylenol  or Ibuprofen (OR MOBIC ).  Monitor your Tylenol  intake to a max of 4,000 mg in a 24 hour period. You can alternate these medications after surgery. Monitor your tylenol  intake since percocet has tylenol  in it.   Diet: 1. Low sodium Heart Healthy Diet is recommended but you are cleared to resume your normal (before surgery) diet after your procedure.   2. It is safe to use a laxative, such as Miralax or Colace, if you have difficulty moving your bowels before surgery. You have been prescribed Sennakot-S to take at bedtime every evening after surgery to keep bowel movements regular and to prevent constipation.     Wound Care: 1. Keep clean and dry.  Shower daily.   Reasons to call the Doctor: Fever - Oral temperature greater than 100.4 degrees Fahrenheit Foul-smelling vaginal discharge Difficulty urinating Nausea and vomiting Increased pain at the site of the incision that is unrelieved with pain medicine. Difficulty breathing with or without chest pain New calf pain especially if only on one side Sudden, continuing increased vaginal bleeding with or without clots.   Contacts: For questions or concerns you should contact:   Dr. Comer Dollar at [phone]   Eleanor Epps, NP at [phone]   After Hours: call [phone] and have the GYN  Oncologist paged/contacted (after 5 pm or on the weekends). You will speak with an after hours RN and let he or she know you have had surgery.   Messages sent via mychart are for non-urgent matters and are not responded to after hours so for urgent needs, please call the after hours number.

## 2023-04-14 NOTE — Transfer of Care (Signed)
 Immediate Anesthesia Transfer of Care Note  Patient: Sarah Chase  Procedure(s) Performed: XI ROBOTIC ASSISTED SALPINGO OOPHORECTOMY (Bilateral)  Patient Location: PACU  Anesthesia Type:General  Level of Consciousness: awake and alert   Airway & Oxygen Therapy: Patient Spontanous Breathing and Patient connected to nasal cannula oxygen  Post-op Assessment: Report given to RN and Post -op Vital signs reviewed and stable  Post vital signs: Reviewed and stable  Last Vitals:  Vitals Value Taken Time  BP 155/88 04/14/23 1215  Temp    Pulse 78 04/14/23 1218  Resp 11 04/14/23 1218  SpO2 100 % 04/14/23 1218  Vitals shown include unfiled device data.  Last Pain:  Vitals:   04/14/23 0907  TempSrc:   PainSc: 0-No pain         Complications: No notable events documented.

## 2023-04-14 NOTE — Op Note (Signed)
 OPERATIVE NOTE  Pre-operative Diagnosis: Adnexal mass, normal tumor markers  Post-operative Diagnosis: same, benign right ovarian cyst on frozen, ovarian torsion  Operation: Robotic-assisted laparoscopic bilateral salpingo-oophorectomy   Surgeon: Viktoria Crank MD  Assistant Surgeon: Eleanor Epps, NP (an NP assistant was necessary for tissue manipulation, management of robotic instrumentation, retraction and positioning due to the complexity of the case and hospital policies).   Anesthesia: GET  Urine Output: 300 cc  Operative Findings: On EUA, smooth mobile mass int he mid abdomen. On intra-abdominal entry, normal upper abdominal survey. Normal omentum, small and large bowel. Much of the pelvic peritoneum and sigmoid mesentery was covered with small powder burn lesions suggestive of a history of endometriosis. Uterus 6 cm and normal in appearance with a 1-2 cm right LUS vs paracervical fibroid. Normal appearing left adnexa. Right fallopian tube normal in appearance. Left ovary enlarged, 12 cm, mostly smooth and cystic with smaller multi-cystic area at the inferior aspect of the mass. Ovary torsed x1. No ascites.  On frozen section, benign cyst of the right ovary noted.  Estimated Blood Loss:  25 cc      Total IV Fluids: see I&O flowsheet         Specimens: bilateral tubes and ovaries, pelvic washings         Complications:  None apparent; patient tolerated the procedure well.         Disposition: PACU - hemodynamically stable.  Procedure Details  The patient was seen in the Holding Room. The risks, benefits, complications, treatment options, and expected outcomes were discussed with the patient.  The patient concurred with the proposed plan, giving informed consent.  The site of surgery properly noted/marked. The patient was identified as Sarah Chase and the procedure verified as a Robotic-assisted bilateral salpingo-oophorectomy with any other indicated procedures.   After  induction of anesthesia, the patient was draped and prepped in the usual sterile manner. Patient was placed in supine position after anesthesia and draped and prepped in the usual sterile manner as follows: Her arms were tucked to her side with all appropriate precautions.  The Patient was secured to the bed with padding and tape across her chest.  The patient was placed in the semi-lithotomy position in [place] stirrups.  The perineum and vagina were prepped with CHG. The patient's abdomen was prepped with ChloraPrep and then she was draped after the prep had been allowed to dry for 3 minutes.  A Time Out was held and the above information confirmed.  The urethra was prepped with Betadine. Foley catheter was placed.  A spongestick was placed in the vagina. OG tube placement was confirmed and to suction.   Next, a 10 mm skin incision was made 1 cm below the subcostal margin in the midclavicular line.  The 5 mm Optiview port and scope was used for direct entry.  Opening pressure was under 10 mm CO2.  The abdomen was insufflated and the findings were noted as above.   At this point and all points during the procedure, the patient's intra-abdominal pressure did not exceed 15 mmHg. Next, an 8 mm skin incision was made superior to the umbilicus and a right and left port were placed about 8 cm lateral to the robot port on the right and left side.  The 5 mm assist trocar was exchanged for a 12 mm airseal port. All ports were placed under direct visualization.  The patient was placed in steep Trendelenburg.  Bowel was folded away into the upper abdomen.  The robot was docked in the normal manner.  The right and left peritoneum were opened parallel to the IP ligament to open the retroperitoneal spaces bilaterally. The round ligaments were preserved. The ureter was noted to be on the medial leaf of the broad ligament.  The peritoneum above the ureter was incised and stretched and the infundibulopelvic ligament was  skeletonized, cauterized and cut.  The utero-ovarian ligament and fallopian tube were skeletonized, cauterized and transected just lateral to the uterine fundus, freeing the adnexa. The right adnexa was placed in a 15 mm Endocatch bag and the left adnexa in a 5 mm Endocatch bag. The enlarged ovary was decompressed within the bag in contained fashion. The adnexa was delivered in piecemeal fashion from the bag and handed off the field to be sent for frozen section. The left tube and ovary were delivered through the LUQ incision within the Enodcatch bag.  Irrigation was used and excellent hemostasis was achieved.  Once frozen returned, the procedure was completed.  Robotic instruments were removed under direct visulaization.  The robot was undocked. The fascia at the 10-12 mm port was closed with 0 Vicryl using a PMI fascial closure device under direct visualization.  The subcuticular tissue was closed with 4-0 Vicryl and the skin was closed with 4-0 Monocryl in a subcuticular manner.  Dermabond was applied.    The vagina was swabbed with  minimal bleeding noted. Foley catheter was removed.  All sponge, lap and needle counts were correct x  3.   The patient was transferred to the recovery room in stable condition.  Comer Dollar, MD

## 2023-04-14 NOTE — Anesthesia Procedure Notes (Signed)
 Procedure Name: Intubation Date/Time: 04/14/2023 10:53 AM  Performed by: Dartha Meckel, CRNAPre-anesthesia Checklist: Patient identified, Emergency Drugs available, Suction available and Patient being monitored Patient Re-evaluated:Patient Re-evaluated prior to induction Oxygen Delivery Method: Circle system utilized Preoxygenation: Pre-oxygenation with 100% oxygen Induction Type: IV induction Ventilation: Mask ventilation without difficulty Laryngoscope Size: Glidescope and 3 Grade View: Grade I Tube type: Oral Tube size: 7.0 mm Number of attempts: 1 Airway Equipment and Method: Stylet and Oral airway Placement Confirmation: ETT inserted through vocal cords under direct vision, positive ETCO2 and breath sounds checked- equal and bilateral Secured at: 21 cm Tube secured with: Tape Dental Injury: Teeth and Oropharynx as per pre-operative assessment

## 2023-04-14 NOTE — Anesthesia Postprocedure Evaluation (Signed)
 Anesthesia Post Note  Patient: Sarah Chase  Procedure(s) Performed: XI ROBOTIC ASSISTED SALPINGO OOPHORECTOMY (Bilateral)     Patient location during evaluation: PACU Anesthesia Type: General Level of consciousness: awake and alert and oriented Pain management: pain level controlled Vital Signs Assessment: post-procedure vital signs reviewed and stable Respiratory status: spontaneous breathing, nonlabored ventilation and respiratory function stable Cardiovascular status: blood pressure returned to baseline and stable Postop Assessment: no apparent nausea or vomiting Anesthetic complications: no   No notable events documented.  Last Vitals:  Vitals:   04/14/23 1300 04/14/23 1314  BP: (!) 158/77   Pulse: 73   Resp: 15   Temp:  36.4 C  SpO2: 97%     Last Pain:  Vitals:   04/14/23 1305  TempSrc:   PainSc: 7                  Timmi Devora A.

## 2023-04-14 NOTE — Anesthesia Postprocedure Evaluation (Deleted)
 Anesthesia Post Note  Patient: Ebonie Westerlund  Procedure(s) Performed: XI ROBOTIC ASSISTED SALPINGO OOPHORECTOMY (Bilateral)     Patient location during evaluation: PACU Anesthesia Type: General Level of consciousness: awake and alert and oriented Pain management: pain level controlled Vital Signs Assessment: post-procedure vital signs reviewed and stable Respiratory status: spontaneous breathing, nonlabored ventilation and respiratory function stable Cardiovascular status: blood pressure returned to baseline and stable Postop Assessment: no apparent nausea or vomiting Anesthetic complications: no   No notable events documented.  Last Vitals:  Vitals:   04/14/23 1215 04/14/23 1224  BP: (!) 155/88   Pulse: 81   Resp: 13   Temp:  (!) 36.3 C  SpO2: 100%     Last Pain:  Vitals:   04/14/23 0907  TempSrc:   PainSc: 0-No pain                 Temitayo Covalt A.

## 2023-04-14 NOTE — Interval H&P Note (Signed)
 History and Physical Interval Note:  04/14/2023 9:49 AM  Sarah Chase  has presented today for surgery, with the diagnosis of adnexal mass.  The various methods of treatment have been discussed with the patient and family. After consideration of risks, benefits and other options for treatment, the patient has consented to  Procedure(s): XI ROBOTIC ASSISTED SALPINGO OOPHORECTOMY, POSSIBLE STAGING INCLUDING HYSTERECTOMY, POSSIBLE LAPAROTOMY (Bilateral) as a surgical intervention.  The patient's history has been reviewed, patient examined, no change in status, stable for surgery.  I have reviewed the patient's chart and labs.  Questions were answered to the patient's satisfaction.     Comer JONELLE Dollar

## 2023-04-15 ENCOUNTER — Encounter (HOSPITAL_COMMUNITY): Payer: Self-pay | Admitting: Gynecologic Oncology

## 2023-04-15 ENCOUNTER — Telehealth: Payer: Self-pay | Admitting: *Deleted

## 2023-04-15 LAB — CYTOLOGY - NON PAP

## 2023-04-15 LAB — SURGICAL PATHOLOGY

## 2023-04-15 NOTE — Telephone Encounter (Addendum)
 Spoke with Ms. Hernandez this morning. She states she is eating, drinking and urinating well. She has had a BM and is passing gas. She is taking senokot as prescribed and encouraged her to drink plenty of water. She denies fever or chills. Incisions are dry and intact. She rates her pain 4/10. Her pain is controlled with tylenol .  Relayed message from Eleanor Epps, NP that labs were drawn after her surgery that were meant to be drawn prior ( but since she had recent labs around 12/23 they didn't draw new ones) Nothing concerning on those labs and findings were expected after surgery.    Instructed to call office with any fever, chills, purulent drainage, uncontrolled pain or any other questions or concerns. Patient verbalizes understanding.   Pt aware of post op appointments as well as the office number [phone] and after hours number [phone] to call if she has any questions or concerns

## 2023-05-06 ENCOUNTER — Encounter: Payer: Self-pay | Admitting: Gynecologic Oncology

## 2023-05-06 ENCOUNTER — Inpatient Hospital Stay: Payer: 59 | Attending: Gynecologic Oncology | Admitting: Gynecologic Oncology

## 2023-05-06 VITALS — BP 136/71 | HR 74 | Resp 20 | Wt 132.0 lb

## 2023-05-06 DIAGNOSIS — N9489 Other specified conditions associated with female genital organs and menstrual cycle: Secondary | ICD-10-CM

## 2023-05-06 NOTE — Progress Notes (Signed)
Gynecologic Oncology Return Clinic Visit  05/06/23  Reason for Visit: follow-up  Treatment History: The patient presented to the emergency department on 04/04/2023 with lower abdominal pain beginning suddenly on the preceding day.  Worsening pain causing nausea.  She had 1 episode of emesis prior to presenting to the emergency department.  Paining was described as constant and waxing and waning in severity.  Pain able to be managed during this visit with plan for close outpatient follow-up. CT abdomen/pelvis on 04/04/2023: Complex cystic mass in the midline of the pelvis superior to the uterus measures 11.2 x 9.2 cm with internal septations.  No free fluid.  Diverticulosis of the left colon noted without diverticulitis.  Multiple duodenal diverticulum noted. Pelvic ultrasound exam on 04/04/2023: Uterus measures 7.1 x 3.5 cm, retroverted and poorly visualized.  Complex cystic mass noted in the midline pelvis, uncertain whether it arises from the right or left ovary.  Estimated to measure 10.3 x 8.6 x 11.6 cm.  Dominant cystic lesion estimated to measure up to 9.7 cm with multiple thickened irregular internal septations.  No abnormal free fluid.  No blood flow demonstrated within the thick internal septations. Tumor markers on 04/04/23: CA-125: 9.5 HE4: 63.7 Postmenopausal ROMA: 1.07 (low risk)  04/14/23: Robotic-assisted laparoscopic bilateral salpingo-oophorectomy   Interval History: Doing well.  Reports baseline bowel bladder function.  Having some itching around her incisions.  Denies any abdominal pain.  Past Medical/Surgical History: Past Medical History:  Diagnosis Date   Cancer (HCC)    basal cell x3 / on nose   Depression    Hx of colonic polyps    Plantar fasciitis     Past Surgical History:  Procedure Laterality Date   biopsy of cyst in bilateral breasts     COLONOSCOPY     ROBOTIC ASSISTED SALPINGO OOPHERECTOMY Bilateral 04/14/2023   Procedure: XI ROBOTIC ASSISTED SALPINGO  OOPHORECTOMY;  Surgeon: Carver Fila, MD;  Location: [facility];  Service: Gynecology;  Laterality: Bilateral;   SKIN CANCER EXCISION     x 3 nose    UPPER GASTROINTESTINAL ENDOSCOPY     WISDOM TOOTH EXTRACTION      Family History  Problem Relation Age of Onset   Colon cancer Father 63       died at 87   Leukemia Mother    Colon polyps Sister    Stomach cancer Maternal Aunt    Stomach cancer Paternal Aunt    Esophageal cancer Neg Hx    Rectal cancer Neg Hx     Social History   Socioeconomic History   Marital status: Single    Spouse name: Not on file   Number of children: Not on file   Years of education: Not on file   Highest education level: Not on file  Occupational History   Not on file  Tobacco Use   Smoking status: Former    Current packs/day: 0.00    Types: Cigarettes    Quit date: 05/17/2017    Years since quitting: 5.9   Smokeless tobacco: Never  Vaping Use   Vaping status: Never Used  Substance and Sexual Activity   Alcohol use: Yes    Alcohol/week: 6.0 - 12.0 standard drinks of alcohol    Types: 6 - 12 Cans of beer per week    Comment: beer (2-3 beers some nights)   Drug use: No   Sexual activity: Not on file  Other Topics Concern   Not on file  Social History Narrative  Not on file   Social Drivers of Health   Financial Resource Strain: Not on file  Food Insecurity: Not on file  Transportation Needs: Not on file  Physical Activity: Not on file  Stress: Not on file  Social Connections: Not on file    Current Medications:  Current Outpatient Medications:    acetaminophen (TYLENOL) 500 MG tablet, Take 1,000 mg by mouth every 6 (six) hours as needed for moderate pain (pain score 4-6)., Disp: , Rfl:    buPROPion (WELLBUTRIN XL) 150 MG 24 hr tablet, Take 150 mg by mouth daily., Disp: , Rfl:    ibuprofen (ADVIL) 200 MG tablet, Take 200 mg by mouth every 6 (six) hours as needed for moderate pain (pain score 4-6)., Disp: , Rfl:    meloxicam  (MOBIC) 15 MG tablet, TAKE 1 TABLET BY MOUTH EVERY DAY, Disp: 30 tablet, Rfl: 3  Review of Systems: Denies appetite changes, fevers, chills, fatigue, unexplained weight changes. Denies hearing loss, neck lumps or masses, mouth sores, ringing in ears or voice changes. Denies cough or wheezing.  Denies shortness of breath. Denies chest pain or palpitations. Denies leg swelling. Denies abdominal distention, pain, blood in stools, constipation, diarrhea, nausea, vomiting, or early satiety. Denies pain with intercourse, dysuria, frequency, hematuria or incontinence. Denies hot flashes, pelvic pain, vaginal bleeding or vaginal discharge.   Denies joint pain, back pain or muscle pain/cramps. Denies itching, rash, or wounds. Denies dizziness, headaches, numbness or seizures. Denies swollen lymph nodes or glands, denies easy bruising or bleeding. Denies anxiety, depression, confusion, or decreased concentration.  Physical Exam: BP 136/71 (BP Location: Right Arm, Patient Position: Sitting)   Pulse 74   Resp 20   Wt 132 lb (59.9 kg)   SpO2 100%   BMI 22.66 kg/m  General: Alert, oriented, no acute distress. HEENT: Posterior oropharynx clear, sclera anicteric. Chest: Unlabored breathing on room air. Abdomen: soft, nontender.  Normoactive bowel sounds.  No masses or hepatosplenomegaly appreciated.  Well-healed incisions. Extremities: Grossly normal range of motion.  Warm, well perfused.  No edema bilaterally.  Laboratory & Radiologic Studies: A. FALLOPIAN TUBE, OVARY, RIGHT, SALPINGO OOPHORECTOMY:  Ovarian cystadenofibroma.  Negative for borderline change or malignancy.  Benign fallopian tube with benign paratubal cyst.   B. FALLOPIAN TUBE, OVARY, LEFT, SALPINGO OOPHORECTOMY:  Benign ovarian cystadenofibroma.  Negative for borderline change or malignancy.  Benign fallopian tube with benign paratubal cyst.   Pelvic washings negative  Assessment & Plan: Sarah Chase is a 68 y.o. woman  who is status post robotic BSO in the setting of a complex adnexal mass with benign final pathology.  Patient is doing well postoperatively, meeting milestones.  Discussed continued expectations and restrictions.  We reviewed findings at the time of surgery as well as pathology.  She was given a copy of her pathology report.  She is discharged back to the rest of her health care team.  Patient given adhesive remover to remove glue from remaining incisions after she showers next.  14 minutes of total time was spent for this patient encounter, including preparation, face-to-face counseling with the patient and coordination of care, and documentation of the encounter.  Eugene Garnet, MD  Division of Gynecologic Oncology  Department of Obstetrics and Gynecology  [facility]

## 2023-05-06 NOTE — Patient Instructions (Signed)
It was good to see you today.  You are healing very well from surgery!  Please remember, no heavy lifting for 6 weeks.
